# Patient Record
Sex: Female | Born: 1955
Health system: Southern US, Community
[De-identification: ages and names within clinical notes are randomized; demographics above are authoritative.]

## PROBLEM LIST (undated history)

## (undated) DIAGNOSIS — R030 Elevated blood-pressure reading, without diagnosis of hypertension: Secondary | ICD-10-CM

## (undated) DIAGNOSIS — M810 Age-related osteoporosis without current pathological fracture: Secondary | ICD-10-CM

## (undated) DIAGNOSIS — I10 Essential (primary) hypertension: Secondary | ICD-10-CM

## (undated) HISTORY — DX: Essential (primary) hypertension: I10

## (undated) HISTORY — PX: OTHER SURGICAL HISTORY: SHX169

## (undated) HISTORY — DX: Age-related osteoporosis without current pathological fracture: M81.0

## (undated) HISTORY — PX: POLYPECTOMY: SHX149

## (undated) HISTORY — DX: Elevated blood-pressure reading, without diagnosis of hypertension: R03.0

---

## 1983-06-12 HISTORY — PX: TUBAL LIGATION: SHX77

## 2009-06-11 HISTORY — PX: COLONOSCOPY: SHX174

## 2009-11-14 LAB — HM COLONOSCOPY

## 2012-07-28 LAB — CBC AND DIFFERENTIAL
Hemoglobin: 13.1 g/dL (ref 12.0–16.0)
Platelets: 265 10*3/uL (ref 150–399)
WBC: 5.9 10^3/mL

## 2012-07-28 LAB — HEPATIC FUNCTION PANEL
ALT: 15 U/L (ref 7–35)
AST: 18 U/L (ref 13–35)
Alkaline Phosphatase: 59 U/L (ref 25–125)

## 2012-07-28 LAB — BASIC METABOLIC PANEL
Creatinine: 0.7 mg/dL (ref 0.5–1.1)
POTASSIUM: 4.4 mmol/L (ref 3.4–5.3)
SODIUM: 144 mmol/L (ref 137–147)

## 2012-07-28 LAB — LIPID PANEL
Cholesterol: 206 mg/dL — AB (ref 0–200)
HDL: 62 mg/dL (ref 35–70)
LDL Cholesterol: 125 mg/dL
Triglycerides: 95 mg/dL (ref 40–160)

## 2012-07-28 LAB — HEMOGLOBIN A1C: HEMOGLOBIN A1C: 5.7 % (ref 4.0–6.0)

## 2012-07-28 LAB — URIC ACID: Uric Acid: 3.8

## 2013-09-29 ENCOUNTER — Encounter: Payer: Self-pay | Admitting: Family Medicine

## 2013-09-29 DIAGNOSIS — R7303 Prediabetes: Secondary | ICD-10-CM | POA: Insufficient documentation

## 2013-09-29 DIAGNOSIS — Z860100 Personal history of colon polyps, unspecified: Secondary | ICD-10-CM | POA: Insufficient documentation

## 2013-09-29 DIAGNOSIS — M8580 Other specified disorders of bone density and structure, unspecified site: Secondary | ICD-10-CM | POA: Insufficient documentation

## 2013-09-29 DIAGNOSIS — E785 Hyperlipidemia, unspecified: Secondary | ICD-10-CM | POA: Insufficient documentation

## 2013-09-30 ENCOUNTER — Encounter: Payer: Self-pay | Admitting: *Deleted

## 2013-10-01 ENCOUNTER — Encounter: Payer: Self-pay | Admitting: Family Medicine

## 2013-10-01 ENCOUNTER — Ambulatory Visit (INDEPENDENT_AMBULATORY_CARE_PROVIDER_SITE_OTHER): Payer: Federal, State, Local not specified - PPO | Admitting: Family Medicine

## 2013-10-01 VITALS — BP 132/80 | HR 78 | Ht 65.0 in | Wt 134.0 lb

## 2013-10-01 DIAGNOSIS — E785 Hyperlipidemia, unspecified: Secondary | ICD-10-CM

## 2013-10-01 DIAGNOSIS — R7309 Other abnormal glucose: Secondary | ICD-10-CM

## 2013-10-01 DIAGNOSIS — M26629 Arthralgia of temporomandibular joint, unspecified side: Secondary | ICD-10-CM | POA: Insufficient documentation

## 2013-10-01 DIAGNOSIS — M899 Disorder of bone, unspecified: Secondary | ICD-10-CM

## 2013-10-01 DIAGNOSIS — M949 Disorder of cartilage, unspecified: Secondary | ICD-10-CM

## 2013-10-01 DIAGNOSIS — M8580 Other specified disorders of bone density and structure, unspecified site: Secondary | ICD-10-CM

## 2013-10-01 DIAGNOSIS — Z1239 Encounter for other screening for malignant neoplasm of breast: Secondary | ICD-10-CM

## 2013-10-01 DIAGNOSIS — R7303 Prediabetes: Secondary | ICD-10-CM

## 2013-10-01 NOTE — Progress Notes (Signed)
CC: Carolyn Berry is a 58 y.o. female is here for Establish Care   Subjective: HPI:  Very pleasant 58 year old here to establish care  She reports a history of TMJ arthralgia bilaterally but is currently not bothering her. It is well managed with wearing a mouthguard to bed every night. Symptoms are accompanied by pain anterior to the ear and mild dizziness.   Reports a history of prediabetes: Most recent A1c 5.7 in February 2014. Denies polyuria polyphagia polydipsia nor poorly healing wounds. Denies vision loss.  Reports a history of osteopenia. Most recent DEXA scan 2011 showing osteopenia in the lumbar sacral spine. She takes vitamin D 800 units daily and calcium 1200 mg daily. She has fractured her toe many years ago but denies any other fractures. She engages in weightbearing activity most days of the week at her gym  She reports a history of hyperlipidemia that is described as borderline. She's never been on medications for this.  Review of Systems - General ROS: negative for - chills, fever, night sweats, weight gain or weight loss Ophthalmic ROS: negative for - decreased vision Psychological ROS: negative for - anxiety or depression ENT ROS: negative for - hearing change, nasal congestion, tinnitus or allergies Hematological and Lymphatic ROS: negative for - bleeding problems, bruising or swollen lymph nodes Breast ROS: negative Respiratory ROS: no cough, shortness of breath, or wheezing Cardiovascular ROS: no chest pain or dyspnea on exertion Gastrointestinal ROS: no abdominal pain, change in bowel habits, or black or bloody stools Genito-Urinary ROS: negative for - genital discharge, genital ulcers, incontinence or abnormal bleeding from genitals Musculoskeletal ROS: negative for - joint pain or muscle pain Neurological ROS: negative for - headaches or memory loss Dermatological ROS: negative for lumps, mole changes, rash and skin lesion changes  History reviewed. No  pertinent past medical history.  Past Surgical History  Procedure Laterality Date  . Tubal ligation  1985   Family History  Problem Relation Age of Onset  . Alcoholism      brother  . Cancer Mother   . Hypertension Mother     History   Social History  . Marital Status: Married    Spouse Name: N/A    Number of Children: N/A  . Years of Education: N/A   Occupational History  . Not on file.   Social History Main Topics  . Smoking status: Never Smoker   . Smokeless tobacco: Not on file  . Alcohol Use: Yes  . Drug Use: No  . Sexual Activity: Yes    Partners: Male   Other Topics Concern  . Not on file   Social History Narrative  . No narrative on file     Objective: BP 132/80  Pulse 78  Ht 5\' 5"  (1.651 m)  Wt 134 lb (60.782 kg)  BMI 22.30 kg/m2  General: Alert and Oriented, No Acute Distress HEENT: Pupils equal, round, reactive to light. Conjunctivae clear.  External ears unremarkable, canals clear with intact TMs with appropriate landmarks.  Middle ear appears open without effusion. Pink inferior turbinates.  Moist mucous membranes, pharynx without inflammation nor lesions.  Neck supple without palpable lymphadenopathy nor abnormal masses. Lungs: Clear to auscultation bilaterally, no wheezing/ronchi/rales.  Comfortable work of breathing. Good air movement. Cardiac: Regular rate and rhythm. Normal S1/S2.  No murmurs, rubs, nor gallops.   Extremities: No peripheral edema.  Strong peripheral pulses.  Mental Status: No depression, anxiety, nor agitation. Skin: Warm and dry.  Assessment & Plan: Tashyra was seen today  for establish care.  Diagnoses and associated orders for this visit:  Prediabetes - HgB S3M - BASIC METABOLIC PANEL WITH GFR  Hyperlipidemia - Lipid panel  Osteopenia - DG Bone Density; Future  Breast cancer screening - MM DIGITAL SCREENING BILATERAL; Future  TMJ arthralgia    Prediabetes: Clinically controlled however due for fasting blood  sugar and A1c Hyperlipidemia: Clinically control but due for annual lipid panel Osteopenia: Overdue for followup DEXA scan TMJ: Stable continue to wear mouth guard as needed It has been greater than one year since her last mammogram we ordered a routine screening today  Followup will be ultimately determined based on above results   Return if symptoms worsen or fail to improve.

## 2013-10-13 ENCOUNTER — Ambulatory Visit (INDEPENDENT_AMBULATORY_CARE_PROVIDER_SITE_OTHER): Payer: Federal, State, Local not specified - PPO

## 2013-10-13 DIAGNOSIS — M8580 Other specified disorders of bone density and structure, unspecified site: Secondary | ICD-10-CM

## 2013-10-13 DIAGNOSIS — M949 Disorder of cartilage, unspecified: Secondary | ICD-10-CM

## 2013-10-13 DIAGNOSIS — M899 Disorder of bone, unspecified: Secondary | ICD-10-CM

## 2013-10-13 DIAGNOSIS — Z1239 Encounter for other screening for malignant neoplasm of breast: Secondary | ICD-10-CM

## 2013-10-13 DIAGNOSIS — Z1231 Encounter for screening mammogram for malignant neoplasm of breast: Secondary | ICD-10-CM

## 2013-10-13 LAB — LIPID PANEL
Cholesterol: 194 mg/dL (ref 0–200)
HDL: 64 mg/dL (ref >39)
LDL Cholesterol: 119 mg/dL — ABNORMAL HIGH (ref 0–99)
Total CHOL/HDL Ratio: 3 Ratio
Triglycerides: 57 mg/dL (ref <150)
VLDL: 11 mg/dL (ref 0–40)

## 2013-10-13 LAB — BASIC METABOLIC PANEL WITH GFR
BUN: 17 mg/dL (ref 6–23)
CHLORIDE: 104 meq/L (ref 96–112)
CO2: 31 meq/L (ref 19–32)
Calcium: 9.3 mg/dL (ref 8.4–10.5)
Creat: 0.79 mg/dL (ref 0.50–1.10)
GFR, EST NON AFRICAN AMERICAN: 82 mL/min
GFR, Est African American: >89 mL/min
Glucose, Bld: 84 mg/dL (ref 70–99)
Potassium: 4.2 mEq/L (ref 3.5–5.3)
Sodium: 140 mEq/L (ref 135–145)

## 2013-10-13 LAB — HEMOGLOBIN A1C
HEMOGLOBIN A1C: 6 % — AB (ref <5.7)
MEAN PLASMA GLUCOSE: 126 mg/dL — AB (ref <117)

## 2013-10-14 ENCOUNTER — Encounter: Payer: Self-pay | Admitting: Family Medicine

## 2014-03-19 ENCOUNTER — Ambulatory Visit (INDEPENDENT_AMBULATORY_CARE_PROVIDER_SITE_OTHER): Payer: Federal, State, Local not specified - PPO | Admitting: Family Medicine

## 2014-03-19 ENCOUNTER — Encounter: Payer: Self-pay | Admitting: Family Medicine

## 2014-03-19 VITALS — BP 118/82 | HR 80 | Ht 65.0 in | Wt 134.0 lb

## 2014-03-19 DIAGNOSIS — Z808 Family history of malignant neoplasm of other organs or systems: Secondary | ICD-10-CM | POA: Insufficient documentation

## 2014-03-19 DIAGNOSIS — M8580 Other specified disorders of bone density and structure, unspecified site: Secondary | ICD-10-CM

## 2014-03-19 DIAGNOSIS — Z0000 Encounter for general adult medical examination without abnormal findings: Secondary | ICD-10-CM | POA: Diagnosis not present

## 2014-03-19 DIAGNOSIS — Z23 Encounter for immunization: Secondary | ICD-10-CM

## 2014-03-19 DIAGNOSIS — R7303 Prediabetes: Secondary | ICD-10-CM

## 2014-03-19 DIAGNOSIS — E785 Hyperlipidemia, unspecified: Secondary | ICD-10-CM

## 2014-03-19 MED ORDER — ZOSTER VACCINE LIVE 19400 UNT/0.65ML ~~LOC~~ SOLR: 0.6500 mL | Freq: Once | SUBCUTANEOUS | Status: DC

## 2014-03-19 NOTE — Patient Instructions (Signed)
Dr. Vijay Durflinger's General Advice Following Your Complete Physical Exam  The Benefits of Regular Exercise: Unless you suffer from an uncontrolled cardiovascular condition, studies strongly suggest that regular exercise and physical activity will add to both the quality and length of your life.  The World Health Organization recommends 150 minutes of moderate intensity aerobic activity every week.  This is best split over 3-4 days a week, and can be as simple as a brisk walk for just over 35 minutes "most days of the week".  This type of exercise has been shown to lower LDL-Cholesterol, lower average blood sugars, lower blood pressure, lower cardiovascular disease risk, improve memory, and increase one's overall sense of wellbeing.  The addition of anaerobic (or "strength training") exercises offers additional benefits including but not limited to increased metabolism, prevention of osteoporosis, and improved overall cholesterol levels.  How Can I Strive For A Low-Fat Diet?: Current guidelines recommend that 25-35 percent of your daily energy (food) intake should come from fats.  One might ask how can this be achieved without having to dissect each meal on a daily basis?  Switch to skim or 1% milk instead of whole milk.  Focus on lean meats such as ground turkey, fresh fish, baked chicken, and lean cuts of beef as your source of dietary protein.  Limit saturated fat consumption to less than 10% of your daily caloric intake.  Limit trans fatty acid consumption primarily by limiting synthetic trans fats such as partially hydrogenated oils (Ex: fried fast foods).  Substitute olive or vegetable oil for solid fats where possible.  Moderation of Salt Intake: Provided you don't carry a diagnosis of congestive heart failure nor renal failure, I recommend a daily allowance of no more than 2300 mg of salt (sodium).  Keeping under this daily goal is associated with a decreased risk of cardiovascular events, creeping  above it can lead to elevated blood pressures and increases your risk of cardiovascular events.  Milligrams (mg) of salt is listed on all nutrition labels, and your daily intake can add up faster than you think.  Most canned and frozen dinners can pack in over half your daily salt allowance in one meal.    Lifestyle Health Risks: Certain lifestyle choices carry specific health risks.  As you may already know, tobacco use has been associated with increasing one's risk of cardiovascular disease, pulmonary disease, numerous cancers, among many other issues.  What you may not know is that there are medications and nicotine replacement strategies that can more than double your chances of successfully quitting.  I would be thrilled to help manage your quitting strategy if you currently use tobacco products.  When it comes to alcohol use, I've yet to find an "ideal" daily allowance.  Provided an individual does not have a medical condition that is exacerbated by alcohol consumption, general guidelines determine "safe drinking" as no more than two standard drinks for a man or no more than one standard drink for a female per day.  However, much debate still exists on whether any amount of alcohol consumption is technically "safe".  My general advice, keep alcohol consumption to a minimum for general health promotion.  If you or others believe that alcohol, tobacco, or recreational drug use is interfering with your life, I would be happy to provide confidential counseling regarding treatment options.  General "Over The Counter" Nutrition Advice: Postmenopausal women should aim for a daily calcium intake of 1200 mg, however a significant portion of this might already be   provided by diets including milk, yogurt, cheese, and other dairy products.  Vitamin D has been shown to help preserve bone density, prevent fatigue, and has even been shown to help reduce falls in the elderly.  Ensuring a daily intake of 800 Units of  Vitamin D is a good place to start to enjoy the above benefits, we can easily check your Vitamin D level to see if you'd potentially benefit from supplementation beyond 800 Units a day.  Folic Acid intake should be of particular concern to women of childbearing age.  Daily consumption of 400-800 mcg of Folic Acid is recommended to minimize the chance of spinal cord defects in a fetus should pregnancy occur.    For many adults, accidents still remain one of the most common culprits when it comes to cause of death.  Some of the simplest but most effective preventitive habits you can adopt include regular seatbelt use, proper helmet use, securing firearms, and regularly testing your smoke and carbon monoxide detectors.  Carolyn Monrroy B. Mallory Schaad DO [facility] [address], [address] [place], [place] [address] Phone: [phone]  

## 2014-03-19 NOTE — Progress Notes (Signed)
CC: Carolyn Berry is a 58 y.o. female is here for Annual Exam   Subjective: HPI:  Colonoscopy: Repeat due 2016 Papsmear: Estimated that last Pap smear was in 2013 and has always been normal. Repeat due in 2016-17 Mammogram: 10/2013 normal repeat 10/2014  DEXA: Osteopenia as of May 2015, repeat 2017  Influenza Vaccine: Will receive today Pneumovax: No current indication Td/Tdap: 2011 UTD Zoster: Was given a prescription today to have this done at a local pharmacy  No alcohol use tobacco or recreational drug use  Review of Systems - General ROS: negative for - chills, fever, night sweats, weight gain or weight loss Ophthalmic ROS: negative for - decreased vision Psychological ROS: negative for - anxiety or depression ENT ROS: negative for - hearing change, nasal congestion, tinnitus or allergies Hematological and Lymphatic ROS: negative for - bleeding problems, bruising or swollen lymph nodes Breast ROS: negative Respiratory ROS: no cough, shortness of breath, or wheezing Cardiovascular ROS: no chest pain or dyspnea on exertion Gastrointestinal ROS: no abdominal pain, change in bowel habits, or black or bloody stools Genito-Urinary ROS: negative for - genital discharge, genital ulcers, incontinence or abnormal bleeding from genitals Musculoskeletal ROS: negative for - joint pain or muscle pain Neurological ROS: negative for - headaches or memory loss Dermatological ROS: negative for lumps, mole changes, rash and skin lesion changes  History reviewed. No pertinent past medical history.  Past Surgical History  Procedure Laterality Date  . Tubal ligation  1985   Family History  Problem Relation Age of Onset  . Alcoholism      brother  . Cancer Mother   . Hypertension Mother     History   Social History  . Marital Status: Married    Spouse Name: N/A    Number of Children: N/A  . Years of Education: N/A   Occupational History  . Not on file.   Social History Main  Topics  . Smoking status: Never Smoker   . Smokeless tobacco: Not on file  . Alcohol Use: Yes  . Drug Use: No  . Sexual Activity: Yes    Partners: Male   Other Topics Concern  . Not on file   Social History Narrative  . No narrative on file     Objective: BP 118/82  Pulse 80  Ht 5\' 5"  (1.651 m)  Wt 134 lb (60.782 kg)  BMI 22.30 kg/m2  General: No Acute Distress HEENT: Atraumatic, normocephalic, conjunctivae normal without scleral icterus.  No nasal discharge, hearing grossly intact, TMs with good landmarks bilaterally with no middle ear abnormalities, posterior pharynx clear without oral lesions. Neck: Supple, trachea midline, no cervical nor supraclavicular adenopathy. Pulmonary: Clear to auscultation bilaterally without wheezing, rhonchi, nor rales. Cardiac: Regular rate and rhythm.  No murmurs, rubs, nor gallops. No peripheral edema.  2+ peripheral pulses bilaterally. Abdomen: Bowel sounds normal.  No masses.  Non-tender without rebound.  Negative Murphy's sign. MSK: Grossly intact, no signs of weakness.  Full strength throughout upper and lower extremities.  Full ROM in upper and lower extremities.  No midline spinal tenderness. Neuro: Gait unremarkable, CN II-XII grossly intact.  C5-C6 Reflex 2/4 Bilaterally, L4 Reflex 2/4 Bilaterally.  Cerebellar function intact. Skin: No rashes. Seborrheic keratoses on the back and neck Psych: Alert and oriented to person/place/time.  Thought process normal. No anxiety/depression.  Assessment & Plan: Lyvia was seen today for annual exam.  Diagnoses and associated orders for this visit:  Influenza vaccine needed - Flu Vaccine QUAD 36+ mos PF  IM (Fluarix Quad PF)  Hyperlipidemia  Osteopenia  Prediabetes - HgB A1c  Annual physical exam - HgB A1c - zoster vaccine live, PF, (ZOSTAVAX) 88828 UNT/0.65ML injection; Inject 19,400 Units into the skin once.  Family history of melanoma    Healthy lifestyle interventions including  but not limited to regular exercise, a healthy low fat diet, moderation of salt intake, the dangers of tobacco/alcohol/recreational drug use, nutrition supplementation, and accident avoidance were discussed with the patient and a handout was provided for future reference.  Due for A1c to followup prediabetes   Return in about 1 year (around 03/20/2015).

## 2014-03-20 LAB — HEMOGLOBIN A1C
Hgb A1c MFr Bld: 5.7 % — ABNORMAL HIGH (ref <5.7)
Mean Plasma Glucose: 117 mg/dL — ABNORMAL HIGH (ref <117)

## 2014-06-15 ENCOUNTER — Encounter: Payer: Self-pay | Admitting: *Deleted

## 2014-09-20 ENCOUNTER — Encounter: Payer: Self-pay | Admitting: *Deleted

## 2014-09-20 ENCOUNTER — Emergency Department
Admission: EM | Admit: 2014-09-20 | Discharge: 2014-09-20 | Disposition: A | Discharge status: Home / Self Care | Payer: Federal, State, Local not specified - PPO | Source: Home / Self Care | Attending: Family Medicine | Admitting: Family Medicine

## 2014-09-20 DIAGNOSIS — J029 Acute pharyngitis, unspecified: Secondary | ICD-10-CM

## 2014-09-20 LAB — POCT RAPID STREP A (OFFICE): RAPID STREP A SCREEN: NEGATIVE

## 2014-09-20 NOTE — Discharge Instructions (Signed)
If cold-like symptoms develop, try the following: Take plain guaifenesin (1200mg  extended release tabs such as Mucinex) twice daily, with plenty of water, for cough and congestion.  May add Pseudoephedrine (30mg , one or two every 4 to 6 hours) for sinus congestion.  Get adequate rest.   May use Afrin nasal spray (or generic oxymetazoline) twice daily for about 5 days.  Also recommend using saline nasal spray several times daily and saline nasal irrigation (AYR is a common brand).   Try warm salt water gargles for sore throat.  Stop all antihistamines for now, and other non-prescription cough/cold preparations. May take Ibuprofen 200mg , 4 tabs every 8 hours with food for sore throat, body aches, etc.   Salt Water Gargle This solution will help make your mouth and throat feel better. HOME CARE INSTRUCTIONS   Mix 1 teaspoon of salt in 8 ounces of warm water.  Gargle with this solution as much or often as you need or as directed. Swish and gargle gently if you have any sores or wounds in your mouth.  Do not swallow this mixture. Document Released: 03/01/2004 Document Revised: 08/20/2011 Document Reviewed: 07/23/2008 [facility] Patient Information 2015 [place], [place]. This information is not intended to replace advice given to you by your health care provider. Make sure you discuss any questions you have with your health care provider.

## 2014-09-20 NOTE — ED Provider Notes (Signed)
CSN: [id number]     Arrival date & time 09/20/14  1401 History   None    Chief Complaint  Patient presents with  . Sore Throat      HPI Comments: Patient developed a sore throat last night with minimal other symptoms.  She is concerned about possible strep, having been exposed to her young grandson with strep pharyngitis.  The history is provided by the patient.    History reviewed. No pertinent past medical history. Past Surgical History  Procedure Laterality Date  . Tubal ligation  1985   Family History  Problem Relation Age of Onset  . Alcoholism      brother  . Cancer Mother   . Hypertension Mother    History  Substance Use Topics  . Smoking status: Never Smoker   . Smokeless tobacco: Not on file  . Alcohol Use: Yes   OB History    No data available     Review of Systems + sore throat No cough No pleuritic pain No wheezing No nasal congestion ? post-nasal drainage No sinus pain/pressure No itchy/red eyes No earache No hemoptysis No SOB No fever/chills No nausea No vomiting No abdominal pain No diarrhea No urinary symptoms No skin rash + fatigue No myalgias No headache Used OTC meds without relief  Allergies  Review of patient's allergies indicates no known allergies.  Home Medications   Prior to Admission medications   Medication Sig Start Date End Date Taking? Authorizing Provider  Calcium Carbonate-Vitamin D (CALCIUM + D PO) Take by mouth.    Historical Provider, MD  Multiple Vitamins-Minerals (MULTIVITAMIN PO) Take by mouth.    Historical Provider, MD  zoster vaccine live, PF, (ZOSTAVAX) 62831 UNT/0.65ML injection Inject 19,400 Units into the skin once. 03/19/14   Sean Hommel, DO   BP 151/90 mmHg  Pulse 75  Temp(Src) 98.3 F (36.8 C) (Oral)  Resp 16  Ht 5\' 5"  (1.651 m)  Wt 135 lb (61.236 kg)  BMI 22.47 kg/m2  SpO2 99% Physical Exam Nursing notes and Vital Signs reviewed. Appearance:  Patient appears stated age, and in no acute  distress Eyes:  Pupils are equal, round, and reactive to light and accomodation.  Extraocular movement is intact.  Conjunctivae are not inflamed  Ears:  Canals normal.  Tympanic membranes normal.  Nose:  Mildly congested turbinates.  No sinus tenderness.   Pharynx:  Small aphthous ulcer on uvula; minimal erythema Neck:  Supple.   Nontender enlarged posterior nodes are palpated bilaterally  Lungs:  Clear to auscultation.  Breath sounds are equal.  Heart:  Regular rate and rhythm without murmurs, rubs, or gallops.  Abdomen:  Nontender without masses or hepatosplenomegaly.  Bowel sounds are present.  No CVA or flank tenderness.  Extremities:  No edema.  No calf tenderness Skin:  No rash present.   ED Course  Procedures  None   Labs Reviewed  STREP A DNA PROBE  POCT RAPID STREP A (OFFICE) negative         MDM   1. Acute pharyngitis, unspecified pharyngitis type; suspect early viral URI    There is no evidence of bacterial infection today.  Throat culture pending  If cold-like symptoms develop, try the following: Take plain guaifenesin (1200mg  extended release tabs such as Mucinex) twice daily, with plenty of water, for cough and congestion.  May add Pseudoephedrine (30mg , one or two every 4 to 6 hours) for sinus congestion.  Get adequate rest.   May use Afrin nasal spray (  or generic oxymetazoline) twice daily for about 5 days.  Also recommend using saline nasal spray several times daily and saline nasal irrigation (AYR is a common brand).   Try warm salt water gargles for sore throat.  Stop all antihistamines for now, and other non-prescription cough/cold preparations. May take Ibuprofen 200mg , 4 tabs every 8 hours with food for sore throat, body aches, etc.    Kandra Nicolas, MD 09/21/14 [phone]

## 2014-09-20 NOTE — ED Notes (Signed)
Pt c/o sore throat x 1 day.denies fever. Reports grandson with strep.

## 2014-09-21 LAB — STREP A DNA PROBE: GASP: NEGATIVE

## 2014-09-24 ENCOUNTER — Telehealth: Payer: Self-pay | Admitting: Emergency Medicine

## 2014-09-24 NOTE — ED Notes (Signed)
Inquired about patient's status; encourage them to call with questions/concerns.  

## 2015-08-30 ENCOUNTER — Telehealth: Payer: Self-pay

## 2015-08-30 DIAGNOSIS — Z0000 Encounter for general adult medical examination without abnormal findings: Secondary | ICD-10-CM

## 2015-08-30 NOTE — Telephone Encounter (Signed)
Pt.notified

## 2015-08-30 NOTE — Telephone Encounter (Signed)
Evonia, Will you please let patient know that I've put lab orders at the front desk that she can use to make a fasting lab only visit at her convenience.

## 2015-08-30 NOTE — Telephone Encounter (Signed)
Sybil called and states she has an appointment in May. She would like to have blood work before the office visit. What labs would you like patient to have done?

## 2015-10-13 DIAGNOSIS — Z0000 Encounter for general adult medical examination without abnormal findings: Secondary | ICD-10-CM | POA: Diagnosis not present

## 2015-10-13 DIAGNOSIS — K080 Exfoliation of teeth due to systemic causes: Secondary | ICD-10-CM | POA: Diagnosis not present

## 2015-10-14 LAB — COMPLETE METABOLIC PANEL WITH GFR
ALBUMIN: 4.2 g/dL (ref 3.6–5.1)
ALK PHOS: 56 U/L (ref 33–130)
ALT: 6 U/L (ref 6–29)
AST: 12 U/L (ref 10–35)
BILIRUBIN TOTAL: 0.6 mg/dL (ref 0.2–1.2)
BUN: 15 mg/dL (ref 7–25)
CALCIUM: 9.1 mg/dL (ref 8.6–10.4)
CO2: 29 mmol/L (ref 20–31)
CREATININE: 0.73 mg/dL (ref 0.50–0.99)
Chloride: 104 mmol/L (ref 98–110)
GFR, Est African American: >89 mL/min (ref >=60)
GFR, Est Non African American: 89 mL/min (ref >=60)
Glucose, Bld: 82 mg/dL (ref 65–99)
Potassium: 4.4 mmol/L (ref 3.5–5.3)
Sodium: 141 mmol/L (ref 135–146)
TOTAL PROTEIN: 6.1 g/dL (ref 6.1–8.1)

## 2015-10-14 LAB — CBC
HCT: 43.8 % (ref 35.0–45.0)
Hemoglobin: 13.9 g/dL (ref 11.7–15.5)
MCH: 26.3 pg — ABNORMAL LOW (ref 27.0–33.0)
MCHC: 31.7 g/dL — AB (ref 32.0–36.0)
MCV: 83 fL (ref 80.0–100.0)
MPV: 9.3 fL (ref 7.5–12.5)
Platelets: 260 10*3/uL (ref 140–400)
RBC: 5.28 MIL/uL — ABNORMAL HIGH (ref 3.80–5.10)
RDW: 13.3 % (ref 11.0–15.0)
WBC: 6.3 10*3/uL (ref 3.8–10.8)

## 2015-10-14 LAB — HEMOGLOBIN A1C
HEMOGLOBIN A1C: 5.7 % — AB (ref <5.7)
MEAN PLASMA GLUCOSE: 117 mg/dL

## 2015-10-14 LAB — LIPID PANEL
CHOLESTEROL: 189 mg/dL (ref 125–200)
HDL: 64 mg/dL (ref >=46)
LDL Cholesterol: 114 mg/dL (ref <130)
Total CHOL/HDL Ratio: 3 Ratio (ref <=5.0)
Triglycerides: 56 mg/dL (ref <150)
VLDL: 11 mg/dL (ref <30)

## 2015-10-19 ENCOUNTER — Ambulatory Visit (INDEPENDENT_AMBULATORY_CARE_PROVIDER_SITE_OTHER): Payer: Federal, State, Local not specified - PPO | Admitting: Family Medicine

## 2015-10-19 ENCOUNTER — Encounter: Payer: Self-pay | Admitting: Family Medicine

## 2015-10-19 VITALS — BP 129/91 | HR 73 | Wt 134.0 lb

## 2015-10-19 DIAGNOSIS — Z1211 Encounter for screening for malignant neoplasm of colon: Secondary | ICD-10-CM | POA: Diagnosis not present

## 2015-10-19 DIAGNOSIS — Z86010 Personal history of colonic polyps: Secondary | ICD-10-CM | POA: Diagnosis not present

## 2015-10-19 DIAGNOSIS — D3140 Benign neoplasm of unspecified ciliary body: Secondary | ICD-10-CM | POA: Insufficient documentation

## 2015-10-19 DIAGNOSIS — Z1239 Encounter for other screening for malignant neoplasm of breast: Secondary | ICD-10-CM

## 2015-10-19 DIAGNOSIS — M8580 Other specified disorders of bone density and structure, unspecified site: Secondary | ICD-10-CM

## 2015-10-19 DIAGNOSIS — D3141 Benign neoplasm of right ciliary body: Secondary | ICD-10-CM

## 2015-10-19 DIAGNOSIS — Z0000 Encounter for general adult medical examination without abnormal findings: Secondary | ICD-10-CM

## 2015-10-19 NOTE — Patient Instructions (Signed)
Dr. Nance Mccombs's General Advice Following Your Complete Physical Exam  The Benefits of Regular Exercise: Unless you suffer from an uncontrolled cardiovascular condition, studies strongly suggest that regular exercise and physical activity will add to both the quality and length of your life.  The World Health Organization recommends 150 minutes of moderate intensity aerobic activity every week.  This is best split over 3-4 days a week, and can be as simple as a brisk walk for just over 35 minutes "most days of the week".  This type of exercise has been shown to lower LDL-Cholesterol, lower average blood sugars, lower blood pressure, lower cardiovascular disease risk, improve memory, and increase one's overall sense of wellbeing.  The addition of anaerobic (or "strength training") exercises offers additional benefits including but not limited to increased metabolism, prevention of osteoporosis, and improved overall cholesterol levels.  How Can I Strive For A Low-Fat Diet?: Current guidelines recommend that 25-35 percent of your daily energy (food) intake should come from fats.  One might ask how can this be achieved without having to dissect each meal on a daily basis?  Switch to skim or 1% milk instead of whole milk.  Focus on lean meats such as ground turkey, fresh fish, baked chicken, and lean cuts of beef as your source of dietary protein.  Limit saturated fat consumption to less than 10% of your daily caloric intake.  Limit trans fatty acid consumption primarily by limiting synthetic trans fats such as partially hydrogenated oils (Ex: fried fast foods).  Substitute olive or vegetable oil for solid fats where possible.  Moderation of Salt Intake: Provided you don't carry a diagnosis of congestive heart failure nor renal failure, I recommend a daily allowance of no more than 2300 mg of salt (sodium).  Keeping under this daily goal is associated with a decreased risk of cardiovascular events, creeping  above it can lead to elevated blood pressures and increases your risk of cardiovascular events.  Milligrams (mg) of salt is listed on all nutrition labels, and your daily intake can add up faster than you think.  Most canned and frozen dinners can pack in over half your daily salt allowance in one meal.    Lifestyle Health Risks: Certain lifestyle choices carry specific health risks.  As you may already know, tobacco use has been associated with increasing one's risk of cardiovascular disease, pulmonary disease, numerous cancers, among many other issues.  What you may not know is that there are medications and nicotine replacement strategies that can more than double your chances of successfully quitting.  I would be thrilled to help manage your quitting strategy if you currently use tobacco products.  When it comes to alcohol use, I've yet to find an "ideal" daily allowance.  Provided an individual does not have a medical condition that is exacerbated by alcohol consumption, general guidelines determine "safe drinking" as no more than two standard drinks for a man or no more than one standard drink for a female per day.  However, much debate still exists on whether any amount of alcohol consumption is technically "safe".  My general advice, keep alcohol consumption to a minimum for general health promotion.  If you or others believe that alcohol, tobacco, or recreational drug use is interfering with your life, I would be happy to provide confidential counseling regarding treatment options.  General "Over The Counter" Nutrition Advice: Postmenopausal women should aim for a daily calcium intake of 1200 mg, however a significant portion of this might already be   provided by diets including milk, yogurt, cheese, and other dairy products.  Vitamin D has been shown to help preserve bone density, prevent fatigue, and has even been shown to help reduce falls in the elderly.  Ensuring a daily intake of 800 Units of  Vitamin D is a good place to start to enjoy the above benefits, we can easily check your Vitamin D level to see if you'd potentially benefit from supplementation beyond 800 Units a day.  Folic Acid intake should be of particular concern to women of childbearing age.  Daily consumption of 400-800 mcg of Folic Acid is recommended to minimize the chance of spinal cord defects in a fetus should pregnancy occur.    For many adults, accidents still remain one of the most common culprits when it comes to cause of death.  Some of the simplest but most effective preventitive habits you can adopt include regular seatbelt use, proper helmet use, securing firearms, and regularly testing your smoke and carbon monoxide detectors.  Carolyn Berry B. Carolyn Outland DO [facility] [address], [address] [place], [place] [address] Phone: [phone]  

## 2015-10-19 NOTE — Progress Notes (Signed)
CC: Carolyn Berry is a 60 y.o. female is here for Annual Exam and Results   Subjective: HPI:  Colonoscopy: Overdue for colonoscopy, or has been placed Papsmear: Last in 2014, repeat by 2019 Mammogram: (Overdue for mammogram, orders have been placed  DEXA: Slightly overdue for DEXA scan, order has been placed  Influenza Vaccine: No current indication Pneumovax: No current indication Td/Tdap: Up-to-date Zoster: Up-to-date from last year  Requesting complete physical exam with no acute complaints  Review of Systems - General ROS: negative for - chills, fever, night sweats, weight gain or weight loss Ophthalmic ROS: negative for - decreased vision Psychological ROS: negative for - anxiety or depression ENT ROS: negative for - hearing change, nasal congestion, tinnitus or allergies Hematological and Lymphatic ROS: negative for - bleeding problems, bruising or swollen lymph nodes Breast ROS: negative Respiratory ROS: no cough, shortness of breath, or wheezing Cardiovascular ROS: no chest pain or dyspnea on exertion Gastrointestinal ROS: no abdominal pain, change in bowel habits, or black or bloody stools Genito-Urinary ROS: negative for - genital discharge, genital ulcers, incontinence or abnormal bleeding from genitals Musculoskeletal ROS: negative for - joint pain or muscle pain Neurological ROS: negative for - headaches or memory loss Dermatological ROS: negative for lumps, mole changes, rash and skin lesion changes  History reviewed. No pertinent past medical history.  Past Surgical History  Procedure Laterality Date  . Tubal ligation  1985   Family History  Problem Relation Age of Onset  . Alcoholism      brother  . Cancer Mother   . Hypertension Mother     Social History   Social History  . Marital Status: Married    Spouse Name: N/A  . Number of Children: N/A  . Years of Education: N/A   Occupational History  . Not on file.   Social History Main Topics  .  Smoking status: Never Smoker   . Smokeless tobacco: Not on file  . Alcohol Use: Yes  . Drug Use: No  . Sexual Activity:    Partners: Male   Other Topics Concern  . Not on file   Social History Narrative     Objective: BP 129/91 mmHg  Pulse 73  Wt 134 lb (60.782 kg)  General: No Acute Distress HEENT: Atraumatic, normocephalic, conjunctivae normal without scleral icterus.  No nasal discharge, hearing grossly intact, TMs with good landmarks bilaterally with no middle ear abnormalities, posterior pharynx clear without oral lesions. Neck: Supple, trachea midline, no cervical nor supraclavicular adenopathy. Brown pigmented lesion approximately 3 mm in diameter in the 2:00 position of the right iris. Pulmonary: Clear to auscultation bilaterally without wheezing, rhonchi, nor rales. Cardiac: Regular rate and rhythm.  No murmurs, rubs, nor gallops. No peripheral edema.  2+ peripheral pulses bilaterally. Abdomen: Bowel sounds normal.  No masses.  Non-tender without rebound.  Negative Murphy's sign. MSK: Grossly intact, no signs of weakness.  Full strength throughout upper and lower extremities.  Full ROM in upper and lower extremities.  No midline spinal tenderness. Neuro: Gait unremarkable, CN II-XII grossly intact.  C5-C6 Reflex 2/4 Bilaterally, L4 Reflex 2/4 Bilaterally.  Cerebellar function intact. Skin: No rashes. Psych: Alert and oriented to person/place/time.  Thought process normal. No anxiety/depression.  Assessment & Plan: Carolyn Berry was seen today for annual exam and results.  Diagnoses and all orders for this visit:  Personal history of colonic polyps  Osteopenia -     DG Bone Density  Screening for breast cancer -  MM DIGITAL SCREENING BILATERAL; Future  Special screening for malignant neoplasms, colon -     Ambulatory referral to Gastroenterology  Annual physical exam  Iris nevus, right   Healthy lifestyle interventions including but not limited to regular  exercise, a healthy low fat diet, moderation of salt intake, the dangers of tobacco/alcohol/recreational drug use, nutrition supplementation, and accident avoidance were discussed with the patient and a handout was provided for future reference. She tells me the pigmented lesion has been in her right eye for her entire life.  Return in about 6 months (around 04/20/2016) for sugar check.

## 2015-10-31 ENCOUNTER — Telehealth: Payer: Self-pay | Admitting: Family Medicine

## 2015-10-31 DIAGNOSIS — Z1211 Encounter for screening for malignant neoplasm of colon: Secondary | ICD-10-CM

## 2015-10-31 NOTE — Telephone Encounter (Signed)
Left detailed message on patient vm with information as noted below. Evva Din,CMA  

## 2015-10-31 NOTE — Telephone Encounter (Signed)
Will you please let patient know that based on records from [facility] her last colonoscopy was in June 2011 and it was recommended she have this repeated in five years from that date.  Since it appears she's due for a routine colonoscopy I've placed a referral today with a Gastroenterologist

## 2015-11-09 ENCOUNTER — Encounter: Payer: Self-pay | Admitting: Family Medicine

## 2016-01-04 ENCOUNTER — Encounter: Payer: Self-pay | Admitting: Family Medicine

## 2016-04-19 DIAGNOSIS — K080 Exfoliation of teeth due to systemic causes: Secondary | ICD-10-CM | POA: Diagnosis not present

## 2016-07-09 DIAGNOSIS — K080 Exfoliation of teeth due to systemic causes: Secondary | ICD-10-CM | POA: Diagnosis not present

## 2016-10-18 DIAGNOSIS — K080 Exfoliation of teeth due to systemic causes: Secondary | ICD-10-CM | POA: Diagnosis not present

## 2017-01-03 ENCOUNTER — Other Ambulatory Visit (HOSPITAL_COMMUNITY)
Admission: RE | Admit: 2017-01-03 | Discharge: 2017-01-03 | Disposition: A | Discharge status: Home / Self Care | Payer: Federal, State, Local not specified - PPO | Source: Ambulatory Visit | Attending: Osteopathic Medicine | Admitting: Osteopathic Medicine

## 2017-01-03 ENCOUNTER — Ambulatory Visit (INDEPENDENT_AMBULATORY_CARE_PROVIDER_SITE_OTHER): Payer: Federal, State, Local not specified - PPO | Admitting: Osteopathic Medicine

## 2017-01-03 ENCOUNTER — Encounter: Payer: Self-pay | Admitting: Osteopathic Medicine

## 2017-01-03 VITALS — BP 127/78 | HR 80 | Ht 65.0 in | Wt 132.0 lb

## 2017-01-03 DIAGNOSIS — M8580 Other specified disorders of bone density and structure, unspecified site: Secondary | ICD-10-CM | POA: Diagnosis not present

## 2017-01-03 DIAGNOSIS — Z1239 Encounter for other screening for malignant neoplasm of breast: Secondary | ICD-10-CM

## 2017-01-03 DIAGNOSIS — Z860100 Personal history of colon polyps, unspecified: Secondary | ICD-10-CM

## 2017-01-03 DIAGNOSIS — Z0000 Encounter for general adult medical examination without abnormal findings: Secondary | ICD-10-CM | POA: Diagnosis not present

## 2017-01-03 DIAGNOSIS — Z1211 Encounter for screening for malignant neoplasm of colon: Secondary | ICD-10-CM | POA: Diagnosis not present

## 2017-01-03 DIAGNOSIS — Z124 Encounter for screening for malignant neoplasm of cervix: Secondary | ICD-10-CM | POA: Insufficient documentation

## 2017-01-03 DIAGNOSIS — Z1231 Encounter for screening mammogram for malignant neoplasm of breast: Secondary | ICD-10-CM

## 2017-01-03 DIAGNOSIS — R7303 Prediabetes: Secondary | ICD-10-CM

## 2017-01-03 MED ORDER — ZOSTER VAC RECOMB ADJUVANTED 50 MCG/0.5ML IM SUSR: 0.5000 mL | Freq: Once | INTRAMUSCULAR | 1 refills | Status: AC

## 2017-01-03 NOTE — Progress Notes (Signed)
HPI: Carolyn Berry is a 61 y.o. female  who presents to [place] today, 01/03/17,  for chief complaint of:  Chief Complaint  Patient presents with  . Annual Exam    Patient here for annual physical / wellness exam.  See preventive care reviewed as below.  Recent labs reviewed in detail with the patient.   Additional concerns today include:  None   Past medical, surgical, social and family history reviewed: Patient Active Problem List   Diagnosis Date Noted  . 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 Family history of melanoma 03/19/2014  . TMJ arthralgia 10/01/2013  . Osteopenia 09/29/2013  . Personal history of colonic polyps 09/29/2013  . Prediabetes 09/29/2013  . Hyperlipidemia 09/29/2013   Past Surgical History:  Procedure Laterality Date  . TUBAL LIGATION  1985   Social History  Substance Use Topics  . Smoking status: Never Smoker  . Smokeless tobacco: Not on file  . Alcohol use Yes   Family History  Problem Relation Age of Onset  . Alcoholism Unknown        brother  . Cancer Mother   . Hypertension Mother      Current medication list and allergy/intolerance information reviewed:   Current Outpatient Prescriptions  Medication Sig Dispense Refill  . Calcium Carbonate-Vitamin D (CALCIUM + D PO) Take by mouth.    . Multiple Vitamins-Minerals (MULTIVITAMIN PO) Take by mouth.     No current facility-administered medications for this visit.    No Known Allergies    Review of Systems:  Constitutional:  No  fever, no chills, No recent illness, No unintentional weight changes. No significant fatigue.   HEENT: No  headache, no vision change, no hearing change,  Cardiac: No  chest pain, No  pressure, No palpitations,  Respiratory:  No  shortness of breath. No  Cough  Gastrointestinal: No  abdominal pain, No  nausea, No  vomiting,  No  blood in stool, No  diarrhea, No  constipation   Musculoskeletal: No new  myalgia/arthralgia  Skin: No  Rash, No other wounds/concerning lesions  Hem/Onc: No  easy bruising/bleeding  Endocrine: No cold intolerance,  No heat intolerance.   Neurologic: No  weakness, No  dizziness  Psychiatric: No  concerns with depression, No  concerns with anxiety, No sleep problems, No mood problems  Exam:  BP 127/78   Pulse 80   Ht 5\' 5"  (1.651 m)   Wt 132 lb (59.9 kg)   BMI 21.97 kg/m   Constitutional: VS see above. General Appearance: alert, well-developed, well-nourished, NAD  Eyes: Normal lids and conjunctive, non-icteric sclera  Ears, Nose, Mouth, Throat: MMM, Normal external inspection ears/nares/mouth/lips/gums. TM normal bilaterally. Pharynx/tonsils no erythema, no exudate. Nasal mucosa normal.   Neck: No masses, trachea midline. No thyroid enlargement. No tenderness/mass appreciated. No lymphadenopathy  Respiratory: Normal respiratory effort. no wheeze, no rhonchi, no rales  Cardiovascular: S1/S2 normal, no murmur, no rub/gallop auscultated. RRR. No lower extremity edema.   Gastrointestinal: Nontender, no masses. No hepatomegaly, no splenomegaly. No hernia appreciated. Bowel sounds normal. Rectal exam deferred.   Musculoskeletal: Gait normal. No clubbing/cyanosis of digits.   Neurological: Normal balance/coordination. No tremor. No cranial nerve deficit on limited exam. Motor and sensation intact and symmetric. Cerebellar reflexes intact.   Skin: warm, dry, intact. No rash/ulcer. No concerning nevi or subq nodules on limited exam.    Psychiatric: Normal judgment/insight. Normal mood and affect. Oriented x3.  GYN: No lesions/ulcers to external genitalia, normal urethra, normal  vaginal mucosa, physiologic discharge, cervix normal with small polyp noted, uterus not enlarged or tender, adnexa no masses and nontender  BREAST: No rashes/skin changes, normal fibrous breast tissue, no masses or tenderness, normal nipple without discharge, normal  axilla    ASSESSMENT/PLAN:   Annual physical exam - Plan: CBC, COMPLETE METABOLIC PANEL WITH GFR, Lipid panel, TSH, VITAMIN D 25 Hydroxy (Vit-D Deficiency, Fractures)  Screening for colon cancer - Plan: Ambulatory referral to Gastroenterology  History of colon polyps - Plan: Ambulatory referral to Gastroenterology  Osteopenia, unspecified location - Plan: DG Bone Density, VITAMIN D 25 Hydroxy (Vit-D Deficiency, Fractures)  Breast cancer screening - Plan: MM DIGITAL SCREENING BILATERAL  Cervical cancer screening - Plan: Cytology - PAP  Prediabetes - Plan: Hemoglobin A1c   Patient Instructions  Plan: Mammogram - due - can get this downstairs Bone Density test - due - can get this downstairs  Pap - done today - results should be back in one week Labs - screening cholesterol, diabetes, thyroid, etc Colonoscopy - due - referral placed Shingles shot - recommended for people over 60 - Rx printed Flu shot annually - recommended  If all tests are normal, come see me in a year unless you need me sooner!     FEMALE PREVENTIVE CARE Updated 01/03/17   ANNUAL SCREENING/COUNSELING  Diet/Exercise - HEALTHY HABITS DISCUSSED TO DECREASE CV RISK History  Smoking Status  . Never Smoker  Smokeless Tobacco  . Never Used   History  Alcohol Use  . Yes   Depression screen PHQ 2/9 01/03/2017  Decreased Interest 0  Down, Depressed, Hopeless 0  PHQ - 2 Score 0    Domestic violence concerns - no  HTN SCREENING - SEE [place]  Sexually active in the past year - Yes with female.  Need/want STI testing today? - no  Concerns about libido or pain with sex? - no  Plans for pregnancy? - n/a  INFECTIOUS DISEASE SCREENING  HIV - needs - declined  GC/CT - does not need  HepC - DOB [birth year] - needs - declined  TB - does not need  DISEASE SCREENING  Lipid - needs  DM2 - needs  Osteoporosis - needs - hc osteopenia  CANCER SCREENING  Cervical - needs  Breast  - needs  Lung - does not need  Colon - needs  ADULT VACCINATION  Influenza - annual vaccine recommended  Td - booster every 10 years   Zoster - option at 50, yes at 60+   PCV13 - was not indicated  PPSV23 - was not indicated Immunization History  Administered Date(s) Administered  . Influenza,inj,Quad PF,36+ Mos 03/19/2014  . Tdap 07/05/2009  . Zoster 06/11/2014   OTHER  Fall - exercise and Vit D age 40+ - does not need  Consider ASA - age 60-59 - does not need     Visit summary with medication list and pertinent instructions was printed for patient to review. All questions at time of visit were answered - patient instructed to contact office with any additional concerns. ER/RTC precautions were reviewed with the patient. Follow-up plan: Return in about 1 year (around 01/03/2018) for [facility], sooner if needed .

## 2017-01-03 NOTE — Patient Instructions (Addendum)
Plan: Mammogram - due - can get this downstairs Bone Density test - due - can get this downstairs  Pap - done today - results should be back in one week Labs - screening cholesterol, diabetes, thyroid, etc Colonoscopy - due - referral placed Shingles shot - recommended for people over 60 - Rx printed Flu shot annually - recommended  If all tests are normal, come see me in a year unless you need me sooner!

## 2017-01-08 LAB — CYTOLOGY - PAP
Diagnosis: NEGATIVE
HPV: NOT DETECTED

## 2017-01-09 ENCOUNTER — Ambulatory Visit (INDEPENDENT_AMBULATORY_CARE_PROVIDER_SITE_OTHER): Payer: Federal, State, Local not specified - PPO

## 2017-01-09 DIAGNOSIS — Z1231 Encounter for screening mammogram for malignant neoplasm of breast: Secondary | ICD-10-CM | POA: Diagnosis not present

## 2017-01-09 DIAGNOSIS — M8588 Other specified disorders of bone density and structure, other site: Secondary | ICD-10-CM

## 2017-01-09 DIAGNOSIS — Z0000 Encounter for general adult medical examination without abnormal findings: Secondary | ICD-10-CM | POA: Diagnosis not present

## 2017-01-09 DIAGNOSIS — M85852 Other specified disorders of bone density and structure, left thigh: Secondary | ICD-10-CM | POA: Diagnosis not present

## 2017-01-09 DIAGNOSIS — M8580 Other specified disorders of bone density and structure, unspecified site: Secondary | ICD-10-CM | POA: Diagnosis not present

## 2017-01-09 DIAGNOSIS — R7303 Prediabetes: Secondary | ICD-10-CM | POA: Diagnosis not present

## 2017-01-09 DIAGNOSIS — M8589 Other specified disorders of bone density and structure, multiple sites: Secondary | ICD-10-CM | POA: Diagnosis not present

## 2017-01-09 LAB — CBC
HCT: 41.7 % (ref 35.0–45.0)
Hemoglobin: 13 g/dL (ref 11.7–15.5)
MCH: 26.7 pg — ABNORMAL LOW (ref 27.0–33.0)
MCHC: 31.2 g/dL — ABNORMAL LOW (ref 32.0–36.0)
MCV: 85.8 fL (ref 80.0–100.0)
MPV: 9.3 fL (ref 7.5–12.5)
Platelets: 262 10*3/uL (ref 140–400)
RBC: 4.86 MIL/uL (ref 3.80–5.10)
RDW: 13.6 % (ref 11.0–15.0)
WBC: 5.6 10*3/uL (ref 3.8–10.8)

## 2017-01-10 LAB — COMPLETE METABOLIC PANEL WITH GFR
ALT: 6 U/L (ref 6–29)
AST: 12 U/L (ref 10–35)
Albumin: 4.1 g/dL (ref 3.6–5.1)
Alkaline Phosphatase: 58 U/L (ref 33–130)
BUN: 11 mg/dL (ref 7–25)
CO2: 28 mmol/L (ref 20–31)
CREATININE: 0.82 mg/dL (ref 0.50–0.99)
Calcium: 9.1 mg/dL (ref 8.6–10.4)
Chloride: 106 mmol/L (ref 98–110)
GFR, Est African American: 88 mL/min (ref >=60)
GFR, Est Non African American: 76 mL/min (ref >=60)
GLUCOSE: 84 mg/dL (ref 65–99)
POTASSIUM: 4.3 mmol/L (ref 3.5–5.3)
Sodium: 142 mmol/L (ref 135–146)
Total Bilirubin: 0.5 mg/dL (ref 0.2–1.2)
Total Protein: 6.2 g/dL (ref 6.1–8.1)

## 2017-01-10 LAB — LIPID PANEL
Cholesterol: 195 mg/dL (ref <200)
HDL: 63 mg/dL (ref >50)
LDL Cholesterol: 120 mg/dL — ABNORMAL HIGH (ref <100)
Total CHOL/HDL Ratio: 3.1 Ratio (ref <5.0)
Triglycerides: 60 mg/dL (ref <150)
VLDL: 12 mg/dL (ref <30)

## 2017-01-10 LAB — HEMOGLOBIN A1C
HEMOGLOBIN A1C: 5.3 % (ref <5.7)
Mean Plasma Glucose: 105 mg/dL

## 2017-01-10 LAB — TSH: TSH: 1.59 mIU/L

## 2017-01-10 LAB — VITAMIN D 25 HYDROXY (VIT D DEFICIENCY, FRACTURES): Vit D, 25-Hydroxy: 32 ng/mL (ref 30–100)

## 2017-02-20 ENCOUNTER — Encounter: Payer: Self-pay | Admitting: Gastroenterology

## 2017-04-23 ENCOUNTER — Ambulatory Visit: Payer: Federal, State, Local not specified - PPO | Admitting: Gastroenterology

## 2017-04-25 DIAGNOSIS — K080 Exfoliation of teeth due to systemic causes: Secondary | ICD-10-CM | POA: Diagnosis not present

## 2017-12-05 DIAGNOSIS — K080 Exfoliation of teeth due to systemic causes: Secondary | ICD-10-CM | POA: Diagnosis not present

## 2018-04-18 ENCOUNTER — Telehealth: Payer: Self-pay | Admitting: Osteopathic Medicine

## 2018-04-18 DIAGNOSIS — R7303 Prediabetes: Secondary | ICD-10-CM

## 2018-04-18 DIAGNOSIS — E785 Hyperlipidemia, unspecified: Secondary | ICD-10-CM

## 2018-04-18 DIAGNOSIS — M8580 Other specified disorders of bone density and structure, unspecified site: Secondary | ICD-10-CM

## 2018-04-18 DIAGNOSIS — Z0000 Encounter for general adult medical examination without abnormal findings: Secondary | ICD-10-CM

## 2018-04-18 NOTE — Telephone Encounter (Signed)
Labs pended for PCP approval

## 2018-04-18 NOTE — Telephone Encounter (Signed)
Patient is scheduled for a physical on 12/3 and would like to have labs done prior to appointment on 11/21. Please place orders.

## 2018-04-21 NOTE — Telephone Encounter (Signed)
Orders are signed.

## 2018-04-21 NOTE — Telephone Encounter (Signed)
Left VM with status update.  

## 2018-05-01 DIAGNOSIS — K080 Exfoliation of teeth due to systemic causes: Secondary | ICD-10-CM | POA: Diagnosis not present

## 2018-05-05 ENCOUNTER — Other Ambulatory Visit: Payer: Self-pay | Admitting: Osteopathic Medicine

## 2018-05-05 DIAGNOSIS — Z1239 Encounter for other screening for malignant neoplasm of breast: Secondary | ICD-10-CM

## 2018-05-06 LAB — TSH: TSH: 1.23 m[IU]/L (ref 0.40–4.50)

## 2018-05-06 LAB — COMPREHENSIVE METABOLIC PANEL
AG Ratio: 2.4 (calc) (ref 1.0–2.5)
ALT: 7 U/L (ref 6–29)
AST: 13 U/L (ref 10–35)
Albumin: 4 g/dL (ref 3.6–5.1)
Alkaline phosphatase (APISO): 62 U/L (ref 33–130)
BUN: 15 mg/dL (ref 7–25)
CO2: 28 mmol/L (ref 20–32)
CREATININE: 0.77 mg/dL (ref 0.50–0.99)
Calcium: 9.1 mg/dL (ref 8.6–10.4)
Chloride: 106 mmol/L (ref 98–110)
Globulin: 1.7 g/dL (calc) — ABNORMAL LOW (ref 1.9–3.7)
Glucose, Bld: 77 mg/dL (ref 65–99)
Potassium: 4 mmol/L (ref 3.5–5.3)
Sodium: 143 mmol/L (ref 135–146)
TOTAL PROTEIN: 5.7 g/dL — AB (ref 6.1–8.1)
Total Bilirubin: 0.4 mg/dL (ref 0.2–1.2)

## 2018-05-06 LAB — CBC
HCT: 36.7 % (ref 35.0–45.0)
Hemoglobin: 11.7 g/dL (ref 11.7–15.5)
MCH: 26.6 pg — ABNORMAL LOW (ref 27.0–33.0)
MCHC: 31.9 g/dL — AB (ref 32.0–36.0)
MCV: 83.4 fL (ref 80.0–100.0)
MPV: 10 fL (ref 7.5–12.5)
PLATELETS: 262 10*3/uL (ref 140–400)
RBC: 4.4 10*6/uL (ref 3.80–5.10)
RDW: 12.7 % (ref 11.0–15.0)
WBC: 4.8 10*3/uL (ref 3.8–10.8)

## 2018-05-06 LAB — HEMOGLOBIN A1C
EAG (MMOL/L): 6.2 (calc)
Hgb A1c MFr Bld: 5.5 % of total Hgb (ref <5.7)
MEAN PLASMA GLUCOSE: 111 (calc)

## 2018-05-06 LAB — LIPID PANEL
CHOL/HDL RATIO: 3.3 (calc) (ref <5.0)
Cholesterol: 187 mg/dL (ref <200)
HDL: 57 mg/dL (ref >50)
LDL Cholesterol (Calc): 115 mg/dL (calc) — ABNORMAL HIGH
NON-HDL CHOLESTEROL (CALC): 130 mg/dL — AB (ref <130)
TRIGLYCERIDES: 60 mg/dL (ref <150)

## 2018-05-13 ENCOUNTER — Telehealth: Payer: Self-pay | Admitting: Osteopathic Medicine

## 2018-05-13 ENCOUNTER — Encounter: Payer: Self-pay | Admitting: Osteopathic Medicine

## 2018-05-13 ENCOUNTER — Ambulatory Visit (INDEPENDENT_AMBULATORY_CARE_PROVIDER_SITE_OTHER): Payer: Federal, State, Local not specified - PPO | Admitting: Osteopathic Medicine

## 2018-05-13 VITALS — BP 137/89 | HR 73 | Temp 98.1°F | Ht 64.25 in | Wt 135.0 lb

## 2018-05-13 DIAGNOSIS — Z0000 Encounter for general adult medical examination without abnormal findings: Secondary | ICD-10-CM | POA: Diagnosis not present

## 2018-05-13 DIAGNOSIS — H00011 Hordeolum externum right upper eyelid: Secondary | ICD-10-CM

## 2018-05-13 DIAGNOSIS — Z860100 Personal history of colon polyps, unspecified: Secondary | ICD-10-CM

## 2018-05-13 DIAGNOSIS — Z1211 Encounter for screening for malignant neoplasm of colon: Secondary | ICD-10-CM

## 2018-05-13 MED ORDER — ERYTHROMYCIN 5 MG/GM OP OINT: 1.0000 "application " | TOPICAL_OINTMENT | Freq: Three times a day (TID) | OPHTHALMIC | 0 refills | Status: AC

## 2018-05-13 NOTE — Patient Instructions (Addendum)
General Preventive Care  Most recent routine screening lipids/other labs: ordered today. Cholesterol and Diabetes screening usually recommended annually.   Everyone should have blood pressure checked once per year.   Tobacco: don't! Alcohol: responsible moderation is ok for most adults  Exercise: as tolerated to reduce risk of cardiovascular disease and diabetes. Strength training will also prevent osteoporosis.   Mental health: if need for mental health care (medicines, counseling, other), or concerns about moods, please let me know!   Sexual health: if need for STD testing, or if concerns with libido/pain problems, please let me know! Vaccines  Flu vaccine: thanks for getting a flu shot this year!   Shingles vaccine: Shingrix recommended after age 26 (old Zostavax already done) - will put you on the list   Pneumonia vaccines: Prevnar and Pneumovax recommended after age 17  Tetanus booster: Tdap recommended every 10 years - due 06/2019 Cancer screenings   Colon cancer screening: last colonoscopy on file from 2011 recommended follow-up in 5 years, will re-send referral to GI   Breast cancer screening: mammogram recommended annually after age 56. Keep scheduled appointment for this later this month.   Cervical cancer screening: Let's repeat Pap at age 20 and if it's normal, you're done!   Lung cancer screening: not needed if never smoker  Infection screenings . HIV: recommended screening at least once age 12-65, or as needed  Hepatitis C: recommended once for anyone born 24-1967 . Gonorrhea/Chlamydia: screening as needed . TB: certain at-risk populations, or depending on work requirements and/or travel history Other . Bone Density Test: recommended for women at age 16 . Advanced Directive: Living Will and/or Healthcare Power of Attorney recommended for all adults, regardless of age or health!          Over-the-Counter Medications & Home Remedies for Upper Respiratory  Illness  Aches/Pains, Fever, Headache Acetaminophen (Tylenol) 500 mg tablets - take max 2 tablets (1000 mg) every 6 hours (4 times per day)  Ibuprofen (Motrin) 200 mg tablets - take max 4 tablets (800 mg) every 6 hours*  Sinus Congestion Nasal Saline if desired Oxymetolazone (Afrin, others) sparing use due to rebound congestion, NEVER use in kids Phenylephrine (Sudafed) 10 mg tablets every 4 hours (or the 12-hour formulation)* Diphenhydramine (Benadryl) 25 mg tablets - take max 2 tablets every 4 hours  Cough & Sore Throat Dextromethorphan (Robitussin, others) - cough suppressant Guaifenesin (Robitussin, Mucinex, others) - expectorant (helps cough up mucus) (Dextromethorphan and Guaifenesin also come in a combination tablet) Lozenges w/ Benzocaine + Menthol (Cepacol) Honey - as much as you want! Teas which "coat the throat" - look for ingredients Elm Bark, Licorice Root, Marshmallow Root  Other Zinc Lozenges within 24 hours of symptoms onset - mixed evidence this shortens the duration of the common cold Don't waste your money on Vitamin C or Echinacea  *Caution in patients with high blood pressure

## 2018-05-13 NOTE — Telephone Encounter (Signed)
-----   Message from Emeterio Reeve, DO sent at 05/13/2018  3:33 PM EST ----- Regarding: shingles listtt shingrix list!

## 2018-05-13 NOTE — Progress Notes (Signed)
HPI: Carolyn Berry is a 62 y.o. female who  has no past medical history on file.  she presents to [facility] today, 05/13/18,  for chief complaint of: Annual physical     Patient here for annual physical / wellness exam.  See preventive care reviewed as below.  Recent labs reviewed in detail with the patient.   Additional concerns today include:   Would like referral to dermatologist for skin check, (+)FH melanoma   R upper eyelid ?stye - swelling and sore 4 days ago, worse 3 days ago, alternating heat/cold compresses and it's improving but not resolved       Past medical, surgical, social and family history reviewed:  Patient Active Problem List   Diagnosis Date Noted  . 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 Family history of melanoma 03/19/2014  . TMJ arthralgia 10/01/2013  . Osteopenia 09/29/2013  . Personal history of colonic polyps 09/29/2013  . Prediabetes 09/29/2013  . Hyperlipidemia 09/29/2013    Past Surgical History:  Procedure Laterality Date  . skin tumor removal ?lipoma - benign    . TUBAL LIGATION  1985    Social History   Tobacco Use  . Smoking status: Never Smoker  . Smokeless tobacco: Never Used  Substance Use Topics  . Alcohol use: Yes    Comment: rare use, social     Family History  Problem Relation Age of Onset  . Alcoholism Unknown        brother  . Cancer Mother   . Hypertension Mother   . Melanoma Mother      Current medication list and allergy/intolerance information reviewed:    Current Outpatient Medications  Medication Sig Dispense Refill  . Calcium Carbonate-Vitamin D (CALCIUM + D PO) Take by mouth.    . Multiple Vitamins-Minerals (MULTIVITAMIN PO) Take by mouth.     No current facility-administered medications for this visit.     No Known Allergies    Review of Systems:  Constitutional:  No  fever, no chills, No recent illness, No unintentional weight changes. No significant fatigue.    HEENT: No  headache, no vision change, no hearing change, No sore throat, No  sinus pressure  Cardiac: No  chest pain, No  pressure, No palpitations, No  Orthopnea  Respiratory:  No  shortness of breath. No  Cough  Gastrointestinal: No  abdominal pain, No  nausea, No  vomiting,  No  blood in stool, No  diarrhea, No  constipation   Musculoskeletal: No new myalgia/arthralgia  Skin: No  Rash, No other wounds, +concerning lesions  Genitourinary: No  incontinence, No  abnormal genital bleeding, No abnormal genital discharge  Hem/Onc: No  easy bruising/bleeding, No  abnormal lymph node  Endocrine: No cold intolerance,  No heat intolerance. No polyuria/polydipsia/polyphagia   Neurologic: No  weakness, No  dizziness, No  slurred speech/focal weakness/facial droop  Psychiatric: No  concerns with depression, No  concerns with anxiety, No sleep problems, No mood problems  Exam:  BP 137/89   Pulse 73   Temp 98.1 F (36.7 C) (Oral)   Ht 5' 4.25" (1.632 m)   Wt 135 lb (61.2 kg)   BMI 22.99 kg/m   Constitutional: VS see above. General Appearance: alert, well-developed, well-nourished, NAD  Eyes: Normal conjunctive, non-icteric sclera, EOMI, PERRL. R upper   Ears, Nose, Mouth, Throat: MMM, Normal external inspection ears/nares/mouth/lips/gums. TM normal bilaterally. Pharynx/tonsils no erythema, no exudate. Nasal mucosa normal.   Neck: No masses, trachea midline. No  thyroid enlargement. No tenderness/mass appreciated. No lymphadenopathy  Respiratory: Normal respiratory effort. no wheeze, no rhonchi, no rales  Cardiovascular: S1/S2 normal, no murmur, no rub/gallop auscultated. RRR. No lower extremity edema.  Gastrointestinal: Nontender, no masses. No hepatomegaly, no splenomegaly. No hernia appreciated. Bowel sounds normal. Rectal exam deferred.   Musculoskeletal: Gait normal. No clubbing/cyanosis of digits.   Neurological: Normal balance/coordination. No tremor. No cranial nerve  deficit on limited exam. Motor and sensation intact and symmetric. Cerebellar reflexes intact.   Skin: warm, dry, intact. No rash/ulcer. No concerning nevi or subq nodules on limited exam.    Psychiatric: Normal judgment/insight. Normal mood and affect. Oriented x3.   BP Readings from Last 3 Encounters:  05/13/18 137/89  01/03/17 127/78  10/19/15 (!) 129/91        ASSESSMENT/PLAN: The primary encounter diagnosis was Annual physical exam. A diagnosis of Hordeolum externum of right upper eyelid was also pertinent to this visit.   Meds ordered this encounter  Medications  . erythromycin ophthalmic ointment    Sig: Place 1 application into the right eye 3 (three) times daily for 5 days. Apply 1 inch ribbon to affected eye TID for 5 days.    Dispense:  3.5 g    Refill:  0    Immunization History  Administered Date(s) Administered  . Influenza,inj,Quad PF,6+ Mos 03/19/2014  . Influenza-Unspecified 03/24/2018  . Tdap 07/05/2009  . Zoster 06/11/2014     Patient Instructions  General Preventive Care  Most recent routine screening lipids/other labs: ordered today. Cholesterol and Diabetes screening usually recommended annually.   Everyone should have blood pressure checked once per year.   Tobacco: don't! Alcohol: responsible moderation is ok for most adults  Exercise: as tolerated to reduce risk of cardiovascular disease and diabetes. Strength training will also prevent osteoporosis.   Mental health: if need for mental health care (medicines, counseling, other), or concerns about moods, please let me know!   Sexual health: if need for STD testing, or if concerns with libido/pain problems, please let me know! Vaccines  Flu vaccine: thanks for getting a flu shot this year!   Shingles vaccine: Shingrix recommended after age 9 (old Zostavax already done) - will put you on the list   Pneumonia vaccines: Prevnar and Pneumovax recommended after age 30  Tetanus booster: Tdap  recommended every 10 years - due 06/2019 Cancer screenings   Colon cancer screening: last colonoscopy on file from 2011 recommended follow-up in 5 years, will re-send referral to GI   Breast cancer screening: mammogram recommended annually after age 63. Keep scheduled appointment for this later this month.   Cervical cancer screening: Let's repeat Pap at age 1 and if it's normal, you're done!   Lung cancer screening: not needed if never smoker  Infection screenings . HIV: recommended screening at least once age 12-65, or as needed  Hepatitis C: recommended once for anyone born 65-1967 . Gonorrhea/Chlamydia: screening as needed . TB: certain at-risk populations, or depending on work requirements and/or travel history Other . Bone Density Test: recommended for women at age 69 . Advanced Directive: Living Will and/or Healthcare Power of Attorney recommended for all adults, regardless of age or health!              Visit summary with medication list and pertinent instructions was printed for patient to review. All questions at time of visit were answered - patient instructed to contact office with any additional concerns or updates. ER/RTC precautions were reviewed with the patient.  Please note: voice recognition software was used to produce this document, and typos may escape review. Please contact Dr. Sheppard Coil for any needed clarifications.     Follow-up plan: Return in about 1 year (around 05/14/2019) for [facility] - sooner if needed.

## 2018-05-13 NOTE — Telephone Encounter (Signed)
Added

## 2018-05-15 ENCOUNTER — Ambulatory Visit (INDEPENDENT_AMBULATORY_CARE_PROVIDER_SITE_OTHER): Payer: Federal, State, Local not specified - PPO

## 2018-05-15 DIAGNOSIS — Z1231 Encounter for screening mammogram for malignant neoplasm of breast: Secondary | ICD-10-CM

## 2018-05-15 DIAGNOSIS — Z1239 Encounter for other screening for malignant neoplasm of breast: Secondary | ICD-10-CM

## 2018-06-17 ENCOUNTER — Ambulatory Visit (INDEPENDENT_AMBULATORY_CARE_PROVIDER_SITE_OTHER): Payer: Federal, State, Local not specified - PPO | Admitting: Osteopathic Medicine

## 2018-06-17 VITALS — BP 148/94 | HR 88 | Temp 98.5°F

## 2018-06-17 DIAGNOSIS — Z23 Encounter for immunization: Secondary | ICD-10-CM | POA: Diagnosis not present

## 2018-06-17 NOTE — Progress Notes (Signed)
Pt came into clinic today for shingles vaccine. Went over possible side effects in great detail. Tolerated injection in right deltoid well, no immediate complications. BP was slightly elevated. Did let her sit prior to recheck, but when I came back to recheck she was doing arm exercises so the "shot doesn't make her sore." No further questions or concerns. Advised to schedule next Shingrix NV in 2 months.

## 2018-06-24 ENCOUNTER — Encounter: Payer: Self-pay | Admitting: Osteopathic Medicine

## 2018-06-26 ENCOUNTER — Telehealth: Payer: Self-pay | Admitting: Gastroenterology

## 2018-06-26 NOTE — Telephone Encounter (Signed)
Received referral for patient to have next colon here. Spoke to patient who states last colon was in 2011. Patient not requesting certain MD. DOD for referral date 73.4.2019 is Dr.Beavers. Records in Epic and also placed on desk for review.

## 2018-06-26 NOTE — Telephone Encounter (Signed)
Thank you. Where can I find these records for review?

## 2018-07-09 NOTE — Telephone Encounter (Signed)
Thank you. I reviewed the procedure note from 2011. Repeat exam recommended in 5 years at that time. May schedule a colonoscopy at her convenience. Thank you.

## 2018-07-09 NOTE — Telephone Encounter (Signed)
Records were printed but also colonoscopy report is in epic from 5.6.2011 under the procedure tab. No path report.

## 2018-08-18 ENCOUNTER — Ambulatory Visit (INDEPENDENT_AMBULATORY_CARE_PROVIDER_SITE_OTHER): Payer: Federal, State, Local not specified - PPO | Admitting: Osteopathic Medicine

## 2018-08-18 VITALS — BP 138/84 | HR 76 | Temp 98.2°F | Wt 137.0 lb

## 2018-08-18 DIAGNOSIS — Z23 Encounter for immunization: Secondary | ICD-10-CM | POA: Diagnosis not present

## 2018-08-18 NOTE — Progress Notes (Signed)
Pt in today for shingrix vaccine. This is 2 of 2 of the shingrix series. Vitals taken and no fever noted. Vaccine was given in left deltoid. Pt tolerated well with no immediate complications.

## 2018-09-03 ENCOUNTER — Encounter: Payer: Self-pay | Admitting: Osteopathic Medicine

## 2019-04-14 ENCOUNTER — Telehealth: Payer: Self-pay | Admitting: Osteopathic Medicine

## 2019-04-14 DIAGNOSIS — M8580 Other specified disorders of bone density and structure, unspecified site: Secondary | ICD-10-CM

## 2019-04-14 DIAGNOSIS — Z0000 Encounter for general adult medical examination without abnormal findings: Secondary | ICD-10-CM

## 2019-04-14 DIAGNOSIS — E785 Hyperlipidemia, unspecified: Secondary | ICD-10-CM

## 2019-04-14 DIAGNOSIS — R7303 Prediabetes: Secondary | ICD-10-CM

## 2019-04-14 NOTE — Telephone Encounter (Signed)
Patient states that she could have it anytime this year, as long as it was before the end of the year. She is aware of her last physical. Left voicemail for patient to ask about medicare. She had only told me she had [employer] but called to verify.

## 2019-04-14 NOTE — Telephone Encounter (Signed)
Is patient aware her last physical was 05/13/18 and it has not been a full year?   Also- just want to verify she is not medicare?

## 2019-04-14 NOTE — Telephone Encounter (Signed)
Patient would like lab work sent in for physical. States she needs to have the physical done before the end of the year. Also wanted to know if an order can be put in for a mammogram. Please advise.

## 2019-04-14 NOTE — Telephone Encounter (Signed)
Thanks for checking!!  Labs pended for Dr A to review and sign off on

## 2019-04-30 ENCOUNTER — Encounter: Payer: Self-pay | Admitting: Osteopathic Medicine

## 2019-04-30 ENCOUNTER — Ambulatory Visit (INDEPENDENT_AMBULATORY_CARE_PROVIDER_SITE_OTHER): Payer: Federal, State, Local not specified - PPO | Admitting: Osteopathic Medicine

## 2019-04-30 ENCOUNTER — Other Ambulatory Visit: Payer: Self-pay

## 2019-04-30 ENCOUNTER — Other Ambulatory Visit: Payer: Self-pay | Admitting: Osteopathic Medicine

## 2019-04-30 ENCOUNTER — Other Ambulatory Visit (HOSPITAL_COMMUNITY)
Admission: RE | Admit: 2019-04-30 | Discharge: 2019-04-30 | Disposition: A | Discharge status: Home / Self Care | Payer: Federal, State, Local not specified - PPO | Source: Ambulatory Visit | Attending: Osteopathic Medicine | Admitting: Osteopathic Medicine

## 2019-04-30 VITALS — BP 141/97 | HR 94 | Temp 97.6°F | Wt 133.1 lb

## 2019-04-30 DIAGNOSIS — Z0000 Encounter for general adult medical examination without abnormal findings: Secondary | ICD-10-CM | POA: Diagnosis not present

## 2019-04-30 DIAGNOSIS — Z124 Encounter for screening for malignant neoplasm of cervix: Secondary | ICD-10-CM | POA: Diagnosis not present

## 2019-04-30 DIAGNOSIS — Z1231 Encounter for screening mammogram for malignant neoplasm of breast: Secondary | ICD-10-CM

## 2019-04-30 DIAGNOSIS — R7303 Prediabetes: Secondary | ICD-10-CM | POA: Diagnosis not present

## 2019-04-30 DIAGNOSIS — E7800 Pure hypercholesterolemia, unspecified: Secondary | ICD-10-CM | POA: Diagnosis not present

## 2019-04-30 DIAGNOSIS — E785 Hyperlipidemia, unspecified: Secondary | ICD-10-CM | POA: Diagnosis not present

## 2019-04-30 DIAGNOSIS — Z23 Encounter for immunization: Secondary | ICD-10-CM | POA: Diagnosis not present

## 2019-04-30 DIAGNOSIS — Z808 Family history of malignant neoplasm of other organs or systems: Secondary | ICD-10-CM

## 2019-04-30 DIAGNOSIS — Z13820 Encounter for screening for osteoporosis: Secondary | ICD-10-CM

## 2019-04-30 DIAGNOSIS — Z1211 Encounter for screening for malignant neoplasm of colon: Secondary | ICD-10-CM

## 2019-04-30 DIAGNOSIS — M8580 Other specified disorders of bone density and structure, unspecified site: Secondary | ICD-10-CM | POA: Diagnosis not present

## 2019-04-30 NOTE — Progress Notes (Signed)
HPI: Carolyn Berry is a 63 y.o. female who  has no past medical history on file.  she presents to [facility] today, 04/30/19,  for chief complaint of: Annual physical    Patient here for annual physical / wellness exam.  See preventive care reviewed as below.   Additional concerns today include:  None!     Past medical, surgical, social and family history reviewed:  Patient Active Problem List   Diagnosis Date Noted  . 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 Family history of melanoma 03/19/2014  . TMJ arthralgia 10/01/2013  . Osteopenia 09/29/2013  . Personal history of colonic polyps 09/29/2013  . Prediabetes 09/29/2013  . Hyperlipidemia 09/29/2013    Past Surgical History:  Procedure Laterality Date  . skin tumor removal ?lipoma - benign    . TUBAL LIGATION  1985    Social History   Tobacco Use  . Smoking status: Never Smoker  . Smokeless tobacco: Never Used  Substance Use Topics  . Alcohol use: Yes    Comment: rare use, social     Family History  Problem Relation Age of Onset  . Alcoholism Unknown        brother  . Cancer Mother   . Hypertension Mother   . Melanoma Mother      Current medication list and allergy/intolerance information reviewed:    Current Outpatient Medications  Medication Sig Dispense Refill  . Calcium Carbonate-Vitamin D (CALCIUM + D PO) Take by mouth.    . Multiple Vitamins-Minerals (MULTIVITAMIN PO) Take by mouth.     No current facility-administered medications for this visit.     No Known Allergies    Review of Systems:  Constitutional:  No  fever, no chills, No recent illness, No unintentional weight changes. No significant fatigue.   HEENT: No  headache, no vision change  Cardiac: No  chest pain, No  pressure, No palpitations, No  Orthopnea  Respiratory:  No  shortness of breath. No  Cough  Gastrointestinal: No  abdominal pain, No  nausea, No  vomiting,  No  blood in stool, No   diarrhea, No  constipation   Musculoskeletal: No new myalgia/arthralgia  Skin: No  Rash, +other wounds/concerning lesions   Genitourinary: No  incontinence, No  abnormal genital bleeding, No abnormal genital discharge  Hem/Onc: No  easy bruising/bleeding  Endocrine: No cold intolerance,  No heat intolerance. No polyuria/polydipsia/polyphagia   Neurologic: No  weakness, No  dizziness  Psychiatric: No  concerns with depression, No  concerns with anxiety, No sleep problems, No mood problems  Exam:  BP (!) 141/97 (BP Location: Left Arm, Patient Position: Sitting, Cuff Size: Normal)   Pulse 94   Temp 97.6 F (36.4 C) (Oral)   Wt 133 lb 1.3 oz (60.4 kg)   BMI 22.67 kg/m   Constitutional: VS see above. General Appearance: alert, well-developed, well-nourished, NAD  Eyes: Normal lids and conjunctive, non-icteric sclera  Neck: No masses, trachea midline. No thyroid enlargement. No tenderness/mass appreciated. No lymphadenopathy  Respiratory: Normal respiratory effort. no wheeze, no rhonchi, no rales  Cardiovascular: S1/S2 normal, no murmur, no rub/gallop auscultated. RRR. No lower extremity edema.   Gastrointestinal: Nontender, no masses.  Musculoskeletal: Gait normal. No clubbing/cyanosis of digits.   Neurological: Normal balance/coordination. No tremor. No cranial nerve deficit on limited exam. Motor and sensation intact and symmetric. Cerebellar reflexes intact.   Skin: warm, dry, intact. No rash/ulcer. No concerning nevi or subq nodules on limited exam.  Few seborrheic  keratoses   Psychiatric: Normal judgment/insight. Normal mood and affect. Oriented x3.  GYN: No lesions/ulcers to external genitalia, normal urethra, normal vaginal mucosa, physiologic discharge, cervix normal without lesions, uterus not enlarged or tender, adnexa no masses and nontender   No results found for this or any previous visit (from the past 36 hour(s)).  No results found.        ASSESSMENT/PLAN: The primary encounter diagnosis was Annual physical exam. Diagnoses of Cervical cancer screening, Osteoporosis screening, Colon cancer screening, Encounter for screening mammogram for malignant neoplasm of breast, Need for 23-polyvalent pneumococcal polysaccharide vaccine, Family history of melanoma, and Need for pneumococcal vaccination were also pertinent to this visit.   Orders Placed This Encounter  Procedures  . DG Bone Density  . Pneumococcal polysaccharide vaccine 23-valent greater than or equal to 2yo subcutaneous/IM  . Ambulatory referral to Gastroenterology  . Ambulatory referral to Dermatology    No orders of the defined types were placed in this encounter.  Patient Instructions  General Preventive Care  Most recent routine screening lipids/other labs: will call once I have results!  Everyone should have blood pressure checked once per year - goal 130 top number and 80 bottom number or less.   Tobacco: don't!   Alcohol: responsible moderation is ok for most adults  Exercise: as tolerated to reduce risk of cardiovascular disease and diabetes. Strength training will also prevent osteoporosis.   Mental health: if need for mental health care (medicines, counseling, other), or concerns about moods, please let me know!   Sexual health: if ever need for STD testing, or if concerns with libido/pain problems, please let me know! Vaccines  Flu vaccine: thanks for getting a flu shot this year!   Shingles vaccine: Shingrix all done!   Pneumonia vaccines: Pneumovax recommended after age 74 - will get this today!   Tetanus booster: Tdap recommended every 10 years  Cancer screenings   Colon cancer screening: last colonoscopy on file from 2011 recommended follow-up in 5 years, will re-send referral to GI   Breast cancer screening: mammogram recommended annually after age 36. Keep scheduled appointment for 05/2019  Cervical cancer screening: Let's  repeat Pap today since we are at age 42 and if it's normal, you're done!   Lung cancer screening: not needed if never smoker  Infection screenings  HIV: recommended screening at least once age 47-65, or as needed  Hepatitis C: recommended once for anyone born 80-1967  Gonorrhea/Chlamydia: screening as needed  TB: certain at-risk populations, or depending on work requirements and/or travel history Other  Bone Density Test: recommended for women at age 49 - ordered!   Advanced Directive: Living Will and/or Healthcare Power of Attorney recommended for all adults, regardless of age or health! See printed information and let me know if any questions!          Visit summary with medication list and pertinent instructions was printed for patient to review. All questions at time of visit were answered - patient instructed to contact office with any additional concerns or updates. ER/RTC precautions were reviewed with the patient.   Please note: voice recognition software was used to produce this document, and typos may escape review. Please contact Dr. Sheppard Coil for any needed clarifications.     Follow-up plan: Return for around a year for [place] before 05/2020.

## 2019-04-30 NOTE — Patient Instructions (Addendum)
General Preventive Care  Most recent routine screening lipids/other labs: will call once I have results!  Everyone should have blood pressure checked once per year - goal 130 top number and 80 bottom number or less.   Tobacco: don't!   Alcohol: responsible moderation is ok for most adults  Exercise: as tolerated to reduce risk of cardiovascular disease and diabetes. Strength training will also prevent osteoporosis.   Mental health: if need for mental health care (medicines, counseling, other), or concerns about moods, please let me know!   Sexual health: if ever need for STD testing, or if concerns with libido/pain problems, please let me know! Vaccines  Flu vaccine: thanks for getting a flu shot this year!   Shingles vaccine: Shingrix all done!   Pneumonia vaccines: Pneumovax recommended after age 19 - will get this today!   Tetanus booster: Tdap recommended every 10 years  Cancer screenings   Colon cancer screening: last colonoscopy on file from 2011 recommended follow-up in 5 years, will re-send referral to GI   Breast cancer screening: mammogram recommended annually after age 64. Keep scheduled appointment for 05/2019  Cervical cancer screening: Let's repeat Pap today since we are at age 55 and if it's normal, you're done!   Lung cancer screening: not needed if never smoker  Infection screenings  HIV: recommended screening at least once age 44-65, or as needed  Hepatitis C: recommended once for anyone born 60-1967  Gonorrhea/Chlamydia: screening as needed  TB: certain at-risk populations, or depending on work requirements and/or travel history Other  Bone Density Test: recommended for women at age 22 - ordered!   Advanced Directive: Living Will and/or Healthcare Power of Attorney recommended for all adults, regardless of age or health! See printed information and let me know if any questions!

## 2019-05-01 LAB — COMPLETE METABOLIC PANEL WITH GFR
AG Ratio: 2.3 (calc) (ref 1.0–2.5)
ALT: 9 U/L (ref 6–29)
AST: 14 U/L (ref 10–35)
Albumin: 4.4 g/dL (ref 3.6–5.1)
Alkaline phosphatase (APISO): 59 U/L (ref 37–153)
BUN: 17 mg/dL (ref 7–25)
CO2: 29 mmol/L (ref 20–32)
Calcium: 9.7 mg/dL (ref 8.6–10.4)
Chloride: 105 mmol/L (ref 98–110)
Creat: 0.85 mg/dL (ref 0.50–0.99)
GFR, Est African American: 83 mL/min/{1.73_m2} (ref >=60)
GFR, Est Non African American: 72 mL/min/{1.73_m2} (ref >=60)
Globulin: 1.9 g/dL (calc) (ref 1.9–3.7)
Glucose, Bld: 86 mg/dL (ref 65–99)
Potassium: 4.3 mmol/L (ref 3.5–5.3)
Sodium: 143 mmol/L (ref 135–146)
Total Bilirubin: 0.6 mg/dL (ref 0.2–1.2)
Total Protein: 6.3 g/dL (ref 6.1–8.1)

## 2019-05-01 LAB — CBC
HCT: 44.1 % (ref 35.0–45.0)
Hemoglobin: 14.2 g/dL (ref 11.7–15.5)
MCH: 27.3 pg (ref 27.0–33.0)
MCHC: 32.2 g/dL (ref 32.0–36.0)
MCV: 84.6 fL (ref 80.0–100.0)
MPV: 10 fL (ref 7.5–12.5)
Platelets: 259 10*3/uL (ref 140–400)
RBC: 5.21 10*6/uL — ABNORMAL HIGH (ref 3.80–5.10)
RDW: 12.2 % (ref 11.0–15.0)
WBC: 7.1 10*3/uL (ref 3.8–10.8)

## 2019-05-01 LAB — LIPID PANEL W/REFLEX DIRECT LDL
Cholesterol: 207 mg/dL — ABNORMAL HIGH (ref <200)
HDL: 61 mg/dL (ref >=50)
LDL Cholesterol (Calc): 128 mg/dL (calc) — ABNORMAL HIGH
Non-HDL Cholesterol (Calc): 146 mg/dL (calc) — ABNORMAL HIGH (ref <130)
Total CHOL/HDL Ratio: 3.4 (calc) (ref <5.0)
Triglycerides: 82 mg/dL (ref <150)

## 2019-05-06 ENCOUNTER — Other Ambulatory Visit: Payer: Self-pay

## 2019-05-06 ENCOUNTER — Ambulatory Visit (INDEPENDENT_AMBULATORY_CARE_PROVIDER_SITE_OTHER): Payer: Federal, State, Local not specified - PPO | Admitting: Osteopathic Medicine

## 2019-05-06 ENCOUNTER — Ambulatory Visit (INDEPENDENT_AMBULATORY_CARE_PROVIDER_SITE_OTHER): Payer: Federal, State, Local not specified - PPO

## 2019-05-06 VITALS — BP 130/85 | HR 76 | Temp 98.3°F | Wt 133.0 lb

## 2019-05-06 DIAGNOSIS — Z0000 Encounter for general adult medical examination without abnormal findings: Secondary | ICD-10-CM

## 2019-05-06 DIAGNOSIS — M810 Age-related osteoporosis without current pathological fracture: Secondary | ICD-10-CM | POA: Diagnosis not present

## 2019-05-06 DIAGNOSIS — Z13820 Encounter for screening for osteoporosis: Secondary | ICD-10-CM

## 2019-05-06 DIAGNOSIS — Z780 Asymptomatic menopausal state: Secondary | ICD-10-CM | POA: Diagnosis not present

## 2019-05-06 DIAGNOSIS — R030 Elevated blood-pressure reading, without diagnosis of hypertension: Secondary | ICD-10-CM | POA: Diagnosis not present

## 2019-05-06 DIAGNOSIS — M85852 Other specified disorders of bone density and structure, left thigh: Secondary | ICD-10-CM | POA: Diagnosis not present

## 2019-05-06 NOTE — Progress Notes (Signed)
Pt in office today for BP check. Pt's BP at last visit was elevated, pt stated she was anxious. Today patients bp is 130/85 and pulse 76.Advised pt I would forward message to provider and if any changes we will notify patient.

## 2019-05-11 DIAGNOSIS — D225 Melanocytic nevi of trunk: Secondary | ICD-10-CM | POA: Diagnosis not present

## 2019-05-11 DIAGNOSIS — D1801 Hemangioma of skin and subcutaneous tissue: Secondary | ICD-10-CM | POA: Diagnosis not present

## 2019-05-11 DIAGNOSIS — L814 Other melanin hyperpigmentation: Secondary | ICD-10-CM | POA: Diagnosis not present

## 2019-05-11 DIAGNOSIS — L821 Other seborrheic keratosis: Secondary | ICD-10-CM | POA: Diagnosis not present

## 2019-05-11 LAB — CYTOLOGY - PAP
Comment: NEGATIVE
Comment: NEGATIVE
Diagnosis: NEGATIVE
HPV 16: NEGATIVE
HPV 18 / 45: NEGATIVE
High risk HPV: POSITIVE — AB

## 2019-05-11 NOTE — Progress Notes (Signed)
BP Readings from Last 3 Encounters:  05/06/19 130/85  04/30/19 (!) 141/97  08/18/18 138/84   BP improved I think we are ok to continue to monitor at least every 6 mos

## 2019-05-21 ENCOUNTER — Ambulatory Visit: Payer: Federal, State, Local not specified - PPO

## 2019-05-21 ENCOUNTER — Ambulatory Visit (INDEPENDENT_AMBULATORY_CARE_PROVIDER_SITE_OTHER): Payer: Federal, State, Local not specified - PPO

## 2019-05-21 ENCOUNTER — Other Ambulatory Visit: Payer: Self-pay

## 2019-05-21 DIAGNOSIS — Z1231 Encounter for screening mammogram for malignant neoplasm of breast: Secondary | ICD-10-CM

## 2019-05-22 ENCOUNTER — Other Ambulatory Visit: Payer: Self-pay | Admitting: Osteopathic Medicine

## 2019-05-22 DIAGNOSIS — R928 Other abnormal and inconclusive findings on diagnostic imaging of breast: Secondary | ICD-10-CM

## 2019-05-26 ENCOUNTER — Ambulatory Visit
Admission: RE | Admit: 2019-05-26 | Discharge: 2019-05-26 | Disposition: A | Discharge status: Home / Self Care | Payer: Federal, State, Local not specified - PPO | Source: Ambulatory Visit | Attending: Osteopathic Medicine | Admitting: Osteopathic Medicine

## 2019-05-26 ENCOUNTER — Other Ambulatory Visit: Payer: Self-pay

## 2019-05-26 ENCOUNTER — Other Ambulatory Visit: Payer: Self-pay | Admitting: Osteopathic Medicine

## 2019-05-26 DIAGNOSIS — N6489 Other specified disorders of breast: Secondary | ICD-10-CM | POA: Diagnosis not present

## 2019-05-26 DIAGNOSIS — R928 Other abnormal and inconclusive findings on diagnostic imaging of breast: Secondary | ICD-10-CM

## 2019-05-26 DIAGNOSIS — N6320 Unspecified lump in the left breast, unspecified quadrant: Secondary | ICD-10-CM | POA: Diagnosis not present

## 2019-06-03 ENCOUNTER — Ambulatory Visit
Admission: RE | Admit: 2019-06-03 | Discharge: 2019-06-03 | Disposition: A | Discharge status: Home / Self Care | Payer: Federal, State, Local not specified - PPO | Source: Ambulatory Visit | Attending: Osteopathic Medicine | Admitting: Osteopathic Medicine

## 2019-06-03 ENCOUNTER — Other Ambulatory Visit: Payer: Self-pay

## 2019-06-03 DIAGNOSIS — N6320 Unspecified lump in the left breast, unspecified quadrant: Secondary | ICD-10-CM | POA: Diagnosis not present

## 2019-06-03 DIAGNOSIS — N6489 Other specified disorders of breast: Secondary | ICD-10-CM

## 2019-06-03 DIAGNOSIS — R928 Other abnormal and inconclusive findings on diagnostic imaging of breast: Secondary | ICD-10-CM | POA: Diagnosis not present

## 2019-06-03 DIAGNOSIS — N6012 Diffuse cystic mastopathy of left breast: Secondary | ICD-10-CM | POA: Diagnosis not present

## 2019-06-08 ENCOUNTER — Encounter: Payer: Self-pay | Admitting: Osteopathic Medicine

## 2019-06-08 ENCOUNTER — Telehealth: Payer: Self-pay

## 2019-06-08 NOTE — Telephone Encounter (Signed)
Pt left a vm msg requesting a diagnostic mammogram referral for a 6 mths f/u. As per pt, breast biopsy was negative for breast cancer. She was recommended to follow up every 6 mths. Pt is requesting referral be placed with Imaging at our location. Pls advise, thanks.

## 2019-06-08 NOTE — Telephone Encounter (Signed)
I've been told we DO NOT do diagnostic mammo or ultrasound downstairs, I think the breast ctr misspoke

## 2019-06-08 NOTE — Telephone Encounter (Signed)
[place] reminder set for 6 mos to order it

## 2019-06-08 NOTE — Telephone Encounter (Signed)
Left a detailed vm msg for pt regarding provider's note. Direct call back info provided.  

## 2019-06-08 NOTE — Telephone Encounter (Signed)
As per pt, she spoke with someone in Imaging today. She did explain her situation and was informed that a diagnostic mammogram can be completed at our location. Pls advise, thanks.

## 2019-06-08 NOTE — Telephone Encounter (Signed)
Breast center will contact her when she is due!

## 2019-06-08 NOTE — Telephone Encounter (Signed)
Pt returned a call back stating she was contacted by the Breast center today. Pt asked the Breast center scheduler if it would be ok for her to have the testing done at our facility in [place] instead of [place]. She was informed that was ok for her to repeat testing at our location. Needs referral to have diagnostic mammogram to be done downstairs. Thanks.

## 2019-07-06 ENCOUNTER — Encounter: Payer: Self-pay | Admitting: Osteopathic Medicine

## 2019-10-14 ENCOUNTER — Telehealth: Payer: Self-pay

## 2019-10-14 NOTE — Telephone Encounter (Signed)
Carolyn Berry scheduled for a CPE this month. She states her insurance will cover a physical and blood work yearly. She states isn't every 12 months but once a year. She would like lab orders.

## 2019-10-20 ENCOUNTER — Other Ambulatory Visit: Payer: Self-pay

## 2019-10-20 DIAGNOSIS — Z0000 Encounter for general adult medical examination without abnormal findings: Secondary | ICD-10-CM

## 2019-10-20 DIAGNOSIS — R7303 Prediabetes: Secondary | ICD-10-CM

## 2019-10-20 DIAGNOSIS — E785 Hyperlipidemia, unspecified: Secondary | ICD-10-CM

## 2019-10-20 NOTE — Progress Notes (Signed)
Pt called requesting annual labs. Task completed. Pt has been updated of lab order.

## 2019-10-22 DIAGNOSIS — R7303 Prediabetes: Secondary | ICD-10-CM | POA: Diagnosis not present

## 2019-10-22 DIAGNOSIS — E785 Hyperlipidemia, unspecified: Secondary | ICD-10-CM | POA: Diagnosis not present

## 2019-10-22 DIAGNOSIS — Z0000 Encounter for general adult medical examination without abnormal findings: Secondary | ICD-10-CM | POA: Diagnosis not present

## 2019-10-23 LAB — COMPLETE METABOLIC PANEL WITH GFR
AG Ratio: 2.4 (calc) (ref 1.0–2.5)
ALT: 8 U/L (ref 6–29)
AST: 12 U/L (ref 10–35)
Albumin: 4.4 g/dL (ref 3.6–5.1)
Alkaline phosphatase (APISO): 53 U/L (ref 37–153)
BUN: 14 mg/dL (ref 7–25)
CO2: 31 mmol/L (ref 20–32)
Calcium: 9 mg/dL (ref 8.6–10.4)
Chloride: 105 mmol/L (ref 98–110)
Creat: 0.73 mg/dL (ref 0.50–0.99)
GFR, Est African American: 100 mL/min/{1.73_m2} (ref >=60)
GFR, Est Non African American: 86 mL/min/{1.73_m2} (ref >=60)
Globulin: 1.8 g/dL (calc) — ABNORMAL LOW (ref 1.9–3.7)
Glucose, Bld: 93 mg/dL (ref 65–99)
Potassium: 4.6 mmol/L (ref 3.5–5.3)
Sodium: 141 mmol/L (ref 135–146)
Total Bilirubin: 0.5 mg/dL (ref 0.2–1.2)
Total Protein: 6.2 g/dL (ref 6.1–8.1)

## 2019-10-23 LAB — CBC
HCT: 43.5 % (ref 35.0–45.0)
Hemoglobin: 14.2 g/dL (ref 11.7–15.5)
MCH: 27.2 pg (ref 27.0–33.0)
MCHC: 32.6 g/dL (ref 32.0–36.0)
MCV: 83.3 fL (ref 80.0–100.0)
MPV: 9.9 fL (ref 7.5–12.5)
Platelets: 249 10*3/uL (ref 140–400)
RBC: 5.22 10*6/uL — ABNORMAL HIGH (ref 3.80–5.10)
RDW: 12.7 % (ref 11.0–15.0)
WBC: 5.4 10*3/uL (ref 3.8–10.8)

## 2019-10-23 LAB — LIPID PANEL
Cholesterol: 192 mg/dL (ref <200)
HDL: 55 mg/dL (ref >=50)
LDL Cholesterol (Calc): 120 mg/dL (calc) — ABNORMAL HIGH
Non-HDL Cholesterol (Calc): 137 mg/dL (calc) — ABNORMAL HIGH (ref <130)
Total CHOL/HDL Ratio: 3.5 (calc) (ref <5.0)
Triglycerides: 74 mg/dL (ref <150)

## 2019-10-23 LAB — HEMOGLOBIN A1C
Hgb A1c MFr Bld: 5.5 % of total Hgb (ref <5.7)
Mean Plasma Glucose: 111 (calc)
eAG (mmol/L): 6.2 (calc)

## 2019-11-02 ENCOUNTER — Other Ambulatory Visit: Payer: Self-pay

## 2019-11-02 ENCOUNTER — Encounter: Payer: Self-pay | Admitting: Osteopathic Medicine

## 2019-11-02 ENCOUNTER — Ambulatory Visit (INDEPENDENT_AMBULATORY_CARE_PROVIDER_SITE_OTHER): Payer: Federal, State, Local not specified - PPO | Admitting: Osteopathic Medicine

## 2019-11-02 VITALS — BP 147/94 | HR 80 | Temp 98.5°F | Wt 133.1 lb

## 2019-11-02 DIAGNOSIS — Z1211 Encounter for screening for malignant neoplasm of colon: Secondary | ICD-10-CM

## 2019-11-02 DIAGNOSIS — Z0000 Encounter for general adult medical examination without abnormal findings: Secondary | ICD-10-CM

## 2019-11-02 DIAGNOSIS — R0600 Dyspnea, unspecified: Secondary | ICD-10-CM

## 2019-11-02 DIAGNOSIS — Z1231 Encounter for screening mammogram for malignant neoplasm of breast: Secondary | ICD-10-CM | POA: Diagnosis not present

## 2019-11-02 DIAGNOSIS — I10 Essential (primary) hypertension: Secondary | ICD-10-CM

## 2019-11-02 DIAGNOSIS — R0609 Other forms of dyspnea: Secondary | ICD-10-CM

## 2019-11-02 MED ORDER — AMBULATORY NON FORMULARY MEDICATION: 99 refills | Status: AC

## 2019-11-02 NOTE — Patient Instructions (Addendum)
General Preventive Care  Most recent routine screening labs: done!   Goal BP 140/90 or less, ideally 130/80 or less  Tobacco: don't!   Alcohol: responsible moderation is ok for most adults - if you have concerns about your alcohol intake, please talk to me!   Exercise: as tolerated to reduce risk of cardiovascular disease and diabetes. Strength training will also prevent osteoporosis.   Mental health: if need for mental health care (medicines, counseling, other), or concerns about moods, please let me know!   Sexual health: if need for STD testing, or if concerns with libido/pain problems, please let me know!   Advanced Directive: Living Will and/or Healthcare Power of Attorney recommended for all adults, regardless of age or health.  Vaccines  Flu vaccine: for almost everyone, every fall.   Shingles vaccine: all done!  Pneumonia vaccine: done!  Tetanus booster: every 10 years - due - ask pharmacy!   COVID vaccine: thanks for getting your vaccine!  Cancer screenings   Colon cancer screening: for everyone age 59-75. Colonoscopy  Was due 2015!  Breast cancer screening: mammogram annually age 43-75  Cervical cancer screening: due 04/2020  Lung cancer screening: not needed for non-smokers  Infection screenings  . HIV: recommended screening at least once age 24-65, more often as needed. . Gonorrhea/Chlamydia: screening as needed . Hepatitis C: recommended once for everyone age 92-75 . TB: certain at-risk folks Other . Bone Density Test: every other year - due 04/2021

## 2019-11-02 NOTE — Progress Notes (Signed)
Carolyn Berry is a 64 y.o. female who presents to  [place] at [facility]  today, 11/02/19, seeking care for the following: . Annual Physical      ASSESSMENT & PLAN with other pertinent history/findings:  The primary encounter diagnosis was Annual physical exam. Diagnoses of Colon cancer screening, Encounter for screening mammogram for malignant neoplasm of breast, and Essential hypertension were also pertinent to this visit.   Having some shortness of breath w/ exertion, no risk factors for respiratory illness, no CP, pt understandably anxious about htis, otherwise healthy - refer to cardio    Patient Instructions  General Preventive Care  Most recent routine screening labs: done!   Goal BP 140/90 or less, ideally 130/80 or less  Tobacco: don't!   Alcohol: responsible moderation is ok for most adults - if you have concerns about your alcohol intake, please talk to me!   Exercise: as tolerated to reduce risk of cardiovascular disease and diabetes. Strength training will also prevent osteoporosis.   Mental health: if need for mental health care (medicines, counseling, other), or concerns about moods, please let me know!   Sexual health: if need for STD testing, or if concerns with libido/pain problems, please let me know!   Advanced Directive: Living Will and/or Healthcare Power of Attorney recommended for all adults, regardless of age or health.  Vaccines  Flu vaccine: for almost everyone, every fall.   Shingles vaccine: all done!  Pneumonia vaccine: done!  Tetanus booster: every 10 years - due - ask pharmacy!   COVID vaccine: thanks for getting your vaccine!  Cancer screenings   Colon cancer screening: for everyone age 89-75. Colonoscopy  Was due 2015!  Breast cancer screening: mammogram annually age 44-75  Cervical cancer screening: due 04/2020  Lung cancer screening: not needed for non-smokers  Infection  screenings  . HIV: recommended screening at least once age 81-65, more often as needed. . Gonorrhea/Chlamydia: screening as needed . Hepatitis C: recommended once for everyone age 2-75 . TB: certain at-risk folks Other . Bone Density Test: every other year - due 04/2021    Orders Placed This Encounter  Procedures  . MM Digital Diagnostic Unilat L  . Ambulatory referral to Gastroenterology    Meds ordered this encounter  Medications  . AMBULATORY NON FORMULARY MEDICATION    Sig: BP machine - cuff around arm. Dx I10    Dispense:  1 Device    Refill:  99       Follow-up instructions: Return for Pap follow-up. .                                         BP (!) 147/94 (BP Location: Left Arm, Patient Position: Sitting, Cuff Size: Normal)   Pulse 80   Temp 98.5 F (36.9 C) (Oral)   Wt 133 lb 1.9 oz (60.4 kg)   BMI 22.67 kg/m   Current Meds  Medication Sig  . Calcium Carbonate-Vitamin D (CALCIUM + D PO) Take by mouth.  . Multiple Vitamins-Minerals (MULTIVITAMIN PO) Take by mouth.    No results found for this or any previous visit (from the past 72 hour(s)).  No results found.  Depression screen [facility] 2/9 11/02/2019 04/30/2019 05/13/2018  Decreased Interest 0 3 0  Down, Depressed, Hopeless 0 0 0  PHQ - 2 Score 0 3 0  Altered sleeping 0 2 -  Tired, decreased energy 0 0 -  Change in appetite 0 0 -  Feeling bad or failure about yourself  0 0 -  Trouble concentrating 0 0 -  Moving slowly or fidgety/restless 0 0 -  Suicidal thoughts 0 0 -  PHQ-9 Score 0 5 -  Difficult doing work/chores - Somewhat difficult -    GAD 7 : Generalized Anxiety Score 11/02/2019 04/30/2019  Nervous, Anxious, on Edge 1 0  Control/stop worrying 0 0  Worry too much - different things 0 0  Trouble relaxing 1 0  Restless 0 0  Easily annoyed or irritable 1 0  Afraid - awful might happen 0 0  Total GAD 7 Score 3 0  Anxiety Difficulty Not difficult at all Not  difficult at all    Constitutional:  . VSS, see nurse notes . General Appearance: alert, well-developed, well-nourished, NAD Neck: . No masses, trachea midline Respiratory: . Normal respiratory effort . No dullness/hyper-resonance to percussion . Breath sounds normal, no wheeze/rhonchi/rales Cardiovascular: . S1/S2 normal, no murmur/rub/gallop auscultated . No lower extremity edema Musculoskeletal:  . Gait normal . No clubbing/cyanosis of digits Neurological: . No cranial nerve deficit on limited exam . Motor and sensation intact and symmetric Psychiatric: . Normal judgment/insight . Normal mood and affect   All questions at time of visit were answered - patient instructed to contact office with any additional concerns or updates.  ER/RTC precautions were reviewed with the patient.  Please note: voice recognition software was used to produce this document, and typos may escape review. Please contact Dr. Sheppard Coil for any needed clarifications.

## 2019-11-18 NOTE — Progress Notes (Signed)
Cardiology Office Note:    Date:  11/20/2019   ID:  Carolyn Berry, DOB [birth year], MRN [id number]  PCP:  Emeterio Reeve, DO  Cardiologist:  Shirlee More, MD   Referring MD: Emeterio Reeve, DO  ASSESSMENT:    1. Essential hypertension   2. SOB (shortness of breath) on exertion   3. Palpitations    PLAN:    In order of problems listed above:  1. I told her I am comfortable looking her readings at home and in the office that she has hypertension and after discussion of benefits we will start an ARB for antihypertensive therapy continue to check blood pressure at home doing all readings in the right arm and follow-up in the office in several weeks to assess response goal is a systolic of less than 938 and diastolics of 80 or less. 2. Her symptoms are not prominent I do not think she requires an ischemia evaluation with normal EKG and exam 3. 7-day ZIO monitor to assess for arrhythmia.  Next appointment 3 to 4 weeks to evaluate her response to antihypertensive therapy and monitor report   Medication Adjustments/Labs and Tests Ordered: Current medicines are reviewed at length with the patient today.  Concerns regarding medicines are outlined above.  Orders Placed This Encounter  Procedures  . LONG TERM MONITOR (3-14 DAYS)  . EKG 12-Lead   Meds ordered this encounter  Medications  . telmisartan (MICARDIS) 40 MG tablet    Sig: Take 1 tablet (40 mg total) by mouth daily.    Dispense:  90 tablet    Refill:  3     Chief Complaint  Patient presents with  . New Patient (Initial Visit)  . Shortness of Breath    History of Present Illness:    Carolyn Berry is a 64 y.o. female who is being seen today for the evaluation of elevated blood pressure readings and palpitation at the request of Emeterio Reeve, DO.  She has been noted to have intermittent elevation of blood pressure and since November has been tracking her home blood pressure which typically runs less  than 101 systolic but runs 75-102 diastolic.  Is been checking blood pressure left arm.  Her last office visit with her primary care physician her blood pressure is significantly elevated.  Today in my office she is 164/98 on the right 162/96 on the left but she has an auscultatory gap on that side which may be influencing the home blood pressure she takes in her left arm.  She is very health-conscious vigorous active and purposely climbs multiple flights of stairs per day.  She says when she finishes climbing multiple flights of stairs she is a little breathless but she does not think it is anything more than it should be for her activity level.  No edema orthopnea or syncope.  She has brief episodes of palpitation which she describes as fluttering and she wonders if she may have atrial fibrillation.  She is not having exertional chest pain.  She has no history of murmur congenital rheumatic heart disease.  The episodes of palpitation happen several times a day. Past Medical History:  Diagnosis Date  . Elevated blood-pressure reading without diagnosis of hypertension     Past Surgical History:  Procedure Laterality Date  . skin tumor removal ?lipoma - benign    . TUBAL LIGATION  1985    Current Medications: Current Meds  Medication Sig  . AMBULATORY NON FORMULARY MEDICATION BP machine - cuff around arm. Dx I10  .  Calcium Carbonate-Vitamin D (CALCIUM + D PO) Take by mouth.  . Multiple Vitamins-Minerals (MULTIVITAMIN PO) Take by mouth.     Allergies:   Patient has no known allergies.   Social History   Socioeconomic History  . Marital status: Married    Spouse name: Not on file  . Number of children: Not on file  . Years of education: Not on file  . Highest education level: Not on file  Occupational History  . Not on file  Tobacco Use  . Smoking status: Never Smoker  . Smokeless tobacco: Never Used  Vaping Use  . Vaping Use: Never used  Substance and Sexual Activity  . Alcohol  use: Yes    Comment: rare use, social   . Drug use: No  . Sexual activity: Yes    Partners: Male  Other Topics Concern  . Not on file  Social History Narrative  . Not on file   Social Determinants of Health   Financial Resource Strain:   . Difficulty of Paying Living Expenses:   Food Insecurity:   . Worried About Charity fundraiser in the Last Year:   . Arboriculturist in the Last Year:   Transportation Needs:   . Film/video editor (Medical):   Marland Kitchen Lack of Transportation (Non-Medical):   Physical Activity:   . Days of Exercise per Week:   . Minutes of Exercise per Session:   Stress:   . Feeling of Stress :   Social Connections:   . Frequency of Communication with Friends and Family:   . Frequency of Social Gatherings with Friends and Family:   . Attends Religious Services:   . Active Member of Clubs or Organizations:   . Attends Archivist Meetings:   Marland Kitchen Marital Status:      Family History: The patient's family history includes Alcoholism in an other family member; Cancer in her mother; Hypertension in her mother; Melanoma in her mother.  ROS:   Review of Systems  Constitutional: Negative.  HENT: Negative.   Eyes: Negative.   Cardiovascular: Positive for palpitations.  Respiratory: Positive for shortness of breath.   Endocrine: Negative.   Hematologic/Lymphatic: Negative.   Skin: Negative.   Musculoskeletal: Negative.   Gastrointestinal: Negative.   Genitourinary: Negative.   Neurological: Negative.   Psychiatric/Behavioral: Negative.   Allergic/Immunologic: Negative.    Please see the history of present illness.     All other systems reviewed and are negative.  EKGs/Labs/Other Studies Reviewed:    The following studies were reviewed today:   EKG:  EKG is  ordered today.  The ekg ordered today is personally reviewed and demonstrates sinus rhythm normal  Recent Labs: 10/22/2019: ALT 8; BUN 14; Creat 0.73; Hemoglobin 14.2; Platelets 249;  Potassium 4.6; Sodium 141  Recent Lipid Panel    Component Value Date/Time   CHOL 192 10/22/2019 0818   TRIG 74 10/22/2019 0818   HDL 55 10/22/2019 0818   CHOLHDL 3.5 10/22/2019 0818   VLDL 12 01/09/2017 0911   LDLCALC 120 (H) 10/22/2019 0818    Physical Exam:    VS:  BP (!) 148/104   Pulse 70   Ht 5' 4.25" (1.632 m)   Wt 130 lb (59 kg)   SpO2 98%   BMI 22.14 kg/m     Wt Readings from Last 3 Encounters:  11/20/19 130 lb (59 kg)  11/02/19 133 lb 1.9 oz (60.4 kg)  05/06/19 133 lb (60.3 kg)  GEN:  Well nourished, well developed in no acute distress HEENT: Normal NECK: No JVD; No carotid bruits LYMPHATICS: No lymphadenopathy CARDIAC: RRR, no murmurs, rubs, gallops RESPIRATORY:  Clear to auscultation without rales, wheezing or rhonchi  ABDOMEN: Soft, non-tender, non-distended MUSCULOSKELETAL:  No edema; No deformity  SKIN: Warm and dry NEUROLOGIC:  Alert and oriented x 3 PSYCHIATRIC:  Normal affect     Signed, Shirlee More, MD  11/20/2019 4:26 PM    [place]

## 2019-11-20 ENCOUNTER — Ambulatory Visit: Payer: Federal, State, Local not specified - PPO | Admitting: Cardiology

## 2019-11-20 ENCOUNTER — Other Ambulatory Visit: Payer: Self-pay

## 2019-11-20 ENCOUNTER — Ambulatory Visit (INDEPENDENT_AMBULATORY_CARE_PROVIDER_SITE_OTHER): Payer: Federal, State, Local not specified - PPO

## 2019-11-20 ENCOUNTER — Encounter: Payer: Self-pay | Admitting: Cardiology

## 2019-11-20 VITALS — BP 148/104 | HR 70 | Ht 64.25 in | Wt 130.0 lb

## 2019-11-20 DIAGNOSIS — I10 Essential (primary) hypertension: Secondary | ICD-10-CM | POA: Diagnosis not present

## 2019-11-20 DIAGNOSIS — R002 Palpitations: Secondary | ICD-10-CM | POA: Diagnosis not present

## 2019-11-20 DIAGNOSIS — R0602 Shortness of breath: Secondary | ICD-10-CM | POA: Diagnosis not present

## 2019-11-20 MED ORDER — TELMISARTAN 40 MG PO TABS: 40.0000 mg | ORAL_TABLET | Freq: Every day | ORAL | 3 refills | Status: DC

## 2019-11-20 NOTE — Patient Instructions (Signed)
Medication Instructions:  Your physician has recommended you make the following change in your medication:  START: Telmisartan 40 mg take one tablet by mouth daily.  *If you need a refill on your cardiac medications before your next appointment, please call your pharmacy*   Lab Work: None If you have labs (blood work) drawn today and your tests are completely normal, you will receive your results only by:  [place] (if you have MyChart) OR  A paper copy in the mail If you have any lab test that is abnormal or we need to change your treatment, we will call you to review the results.   Testing/Procedures: A zio monitor was ordered today. It will remain on for 7 days. You will then return monitor and event diary in provided box. It takes 1-2 weeks for report to be downloaded and returned to [place]. We will call you with the results. If monitor falls off or has orange flashing light, please call Zio for further instructions.     Follow-Up: At [facility], you and your health needs are our priority.  As part of our continuing mission to provide you with exceptional heart care, we have created designated Provider Care Teams.  These Care Teams include your primary Cardiologist (physician) and Advanced Practice Providers (APPs -  Physician Assistants and Nurse Practitioners) who all work together to provide you with the care you need, when you need it.  We recommend signing up for the patient portal called "MyChart".  Sign up information is provided on this After Visit Summary.  MyChart is used to connect with patients for Virtual Visits (Telemedicine).  Patients are able to view lab/test results, encounter notes, upcoming appointments, etc.  Non-urgent messages can be sent to your provider as well.   To learn more about what you can do with MyChart, go to [email or web address].    Your next appointment:   3-4 month(s)  The format for your next appointment:   In Person  Provider:    Shirlee More, MD or Berniece Salines, MD   Other Instructions

## 2019-11-23 ENCOUNTER — Encounter: Payer: Self-pay | Admitting: Gastroenterology

## 2019-11-23 ENCOUNTER — Other Ambulatory Visit: Payer: Self-pay | Admitting: Osteopathic Medicine

## 2019-11-23 DIAGNOSIS — Z1231 Encounter for screening mammogram for malignant neoplasm of breast: Secondary | ICD-10-CM

## 2019-11-24 ENCOUNTER — Ambulatory Visit: Payer: Federal, State, Local not specified - PPO | Admitting: Osteopathic Medicine

## 2019-11-24 ENCOUNTER — Other Ambulatory Visit: Payer: Self-pay

## 2019-11-24 ENCOUNTER — Encounter: Payer: Self-pay | Admitting: Osteopathic Medicine

## 2019-11-24 VITALS — BP 159/91 | HR 80 | Temp 98.4°F | Ht 64.5 in | Wt 132.0 lb

## 2019-11-24 DIAGNOSIS — M7918 Myalgia, other site: Secondary | ICD-10-CM | POA: Diagnosis not present

## 2019-11-24 DIAGNOSIS — I10 Essential (primary) hypertension: Secondary | ICD-10-CM

## 2019-11-24 DIAGNOSIS — M778 Other enthesopathies, not elsewhere classified: Secondary | ICD-10-CM

## 2019-11-24 MED ORDER — DICLOFENAC SODIUM 1 % EX GEL: 4.0000 g | Freq: Four times a day (QID) | CUTANEOUS | 11 refills | Status: DC

## 2019-11-24 NOTE — Progress Notes (Signed)
Carolyn Berry is a 63 y.o. female who presents to  [place] at [facility]  today, 11/24/19, seeking care for the following: . HTN - seen by cardiology, started ARB, goal BP 130/80 or less. Not started Rx yet. Home BP still above goal  . Fall this weekend from her grandson's pogo stick, she landed on L hand and L hip/buttock. (+)bruising and some      ASSESSMENT & PLAN with other pertinent history/findings:  The primary encounter diagnosis was Essential hypertension. Diagnoses of Tendinitis of left wrist and Left buttock pain were also pertinent to this visit.  Conservative measures for orthopedic injury, no fracture suspected, (+)pain w/ resistance to thumb abduction but ROM otherwise ok and strength ok, neg finkelstein's. (+)tendelenberg    There are no Patient Instructions on file for this visit.   No orders of the defined types were placed in this encounter.   Meds ordered this encounter  Medications  . diclofenac Sodium (VOLTAREN) 1 % GEL    Sig: Apply 4 g topically 4 (four) times daily. To affected joint.    Dispense:  150 g    Refill:  11       Follow-up instructions: Return for RECHECK PENDING BP RESULTS / IF WORSE OR CHANGE.                                         BP (!) 159/91   Pulse 80   Temp 98.4 F (36.9 C) (Oral)   Ht 5' 4.5" (1.638 m)   Wt 132 lb (59.9 kg)   SpO2 100% Comment: on RA  BMI 22.31 kg/m   Current Meds  Medication Sig  . AMBULATORY NON FORMULARY MEDICATION BP machine - cuff around arm. Dx I10  . Calcium Carbonate-Vitamin D (CALCIUM + D PO) Take by mouth.  . Multiple Vitamins-Minerals (MULTIVITAMIN PO) Take by mouth.    No results found for this or any previous visit (from the past 72 hour(s)).  No results found.  Depression screen [facility] 2/9 11/02/2019 04/30/2019 05/13/2018  Decreased Interest 0 3 0  Down, Depressed, Hopeless 0 0 0  PHQ - 2 Score 0  3 0  Altered sleeping 0 2 -  Tired, decreased energy 0 0 -  Change in appetite 0 0 -  Feeling bad or failure about yourself  0 0 -  Trouble concentrating 0 0 -  Moving slowly or fidgety/restless 0 0 -  Suicidal thoughts 0 0 -  PHQ-9 Score 0 5 -  Difficult doing work/chores - Somewhat difficult -    GAD 7 : Generalized Anxiety Score 11/02/2019 04/30/2019  Nervous, Anxious, on Edge 1 0  Control/stop worrying 0 0  Worry too much - different things 0 0  Trouble relaxing 1 0  Restless 0 0  Easily annoyed or irritable 1 0  Afraid - awful might happen 0 0  Total GAD 7 Score 3 0  Anxiety Difficulty Not difficult at all Not difficult at all      All questions at time of visit were answered - patient instructed to contact office with any additional concerns or updates.  ER/RTC precautions were reviewed with the patient.  Please note: voice recognition software was used to produce this document, and typos may escape review. Please contact Dr. Sheppard Coil for any needed clarifications.

## 2019-12-17 DIAGNOSIS — R002 Palpitations: Secondary | ICD-10-CM | POA: Diagnosis not present

## 2019-12-21 ENCOUNTER — Other Ambulatory Visit: Payer: Self-pay | Admitting: Osteopathic Medicine

## 2019-12-21 DIAGNOSIS — N63 Unspecified lump in unspecified breast: Secondary | ICD-10-CM

## 2019-12-23 ENCOUNTER — Other Ambulatory Visit: Payer: Self-pay

## 2019-12-23 ENCOUNTER — Ambulatory Visit
Admission: RE | Admit: 2019-12-23 | Discharge: 2019-12-23 | Disposition: A | Discharge status: Home / Self Care | Payer: Federal, State, Local not specified - PPO | Source: Ambulatory Visit | Attending: Osteopathic Medicine | Admitting: Osteopathic Medicine

## 2019-12-23 DIAGNOSIS — R928 Other abnormal and inconclusive findings on diagnostic imaging of breast: Secondary | ICD-10-CM | POA: Diagnosis not present

## 2020-01-14 ENCOUNTER — Ambulatory Visit (AMBULATORY_SURGERY_CENTER): Payer: Self-pay

## 2020-01-14 ENCOUNTER — Other Ambulatory Visit: Payer: Self-pay

## 2020-01-14 VITALS — Ht 64.5 in | Wt 130.2 lb

## 2020-01-14 DIAGNOSIS — Z1211 Encounter for screening for malignant neoplasm of colon: Secondary | ICD-10-CM

## 2020-01-14 NOTE — Progress Notes (Signed)
No allergies to soy or egg Pt is not on blood thinners or diet pills Denies issues with sedation/intubation Denies atrial flutter/fib Denies constipation   Emmi instructions given to pt  Pt is aware of Covid safety and care partner requirements.  Patient declined Suprep due to taste.  After discussing option, pt requested HD miralax as prep.

## 2020-02-04 ENCOUNTER — Other Ambulatory Visit: Payer: Self-pay

## 2020-02-04 ENCOUNTER — Ambulatory Visit (AMBULATORY_SURGERY_CENTER): Payer: Federal, State, Local not specified - PPO | Admitting: Gastroenterology

## 2020-02-04 ENCOUNTER — Encounter: Payer: Self-pay | Admitting: Gastroenterology

## 2020-02-04 VITALS — BP 106/66 | HR 59 | Temp 97.9°F | Resp 18 | Ht 64.0 in | Wt 130.0 lb

## 2020-02-04 DIAGNOSIS — D120 Benign neoplasm of cecum: Secondary | ICD-10-CM

## 2020-02-04 DIAGNOSIS — Z1211 Encounter for screening for malignant neoplasm of colon: Secondary | ICD-10-CM

## 2020-02-04 DIAGNOSIS — Z86010 Personal history of colonic polyps: Secondary | ICD-10-CM

## 2020-02-04 MED ORDER — SODIUM CHLORIDE 0.9 % IV SOLN: 500.0000 mL | Freq: Once | INTRAVENOUS | Status: DC

## 2020-02-04 NOTE — Progress Notes (Signed)
Vital signs checked by:CW  The patient states no changes in medical or surgical history since pre-visit screening on 01/14/2020.

## 2020-02-04 NOTE — Op Note (Addendum)
[place] Patient Name: Carolyn Berry Procedure Date: 02/04/2020 8:55 AM MRN: [id number] Endoscopist: Thornton Park MD, MD Age: 64 Referring MD:  Date of Birth: 1955-12-07 Gender: Female Account #: [id number] Procedure:                Colonoscopy Indications:              Surveillance: Personal history of adenomatous                            polyps on last colonoscopy > 5 years ago                           History of colon polyps                           Last colonosopy was normal - 2011 in [place], [place]                           No known family history of colon cancer or polyps Medicines:                Monitored Anesthesia Care Procedure:                Pre-Anesthesia Assessment:                           - Prior to the procedure, a History and Physical                            was performed, and patient medications and                            allergies were reviewed. The patient's tolerance of                            previous anesthesia was also reviewed. The risks                            and benefits of the procedure and the sedation                            options and risks were discussed with the patient.                            All questions were answered, and informed consent                            was obtained. Prior Anticoagulants: The patient has                            taken no previous anticoagulant or antiplatelet                            agents. ASA Grade Assessment: II - A patient with  mild systemic disease. After reviewing the risks                            and benefits, the patient was deemed in                            satisfactory condition to undergo the procedure.                           After obtaining informed consent, the colonoscope                            was passed under direct vision. Throughout the                            procedure, the patient's blood pressure, pulse,  and                            oxygen saturations were monitored continuously. The                            Colonoscope was introduced through the anus and                            advanced to the 3 cm into the ileum. A second                            forward view of the right colon was performed. The                            colonoscopy was performed without difficulty. The                            patient tolerated the procedure well. The quality                            of the bowel preparation was good. However, there                            was a large volume of green liquid that needed to                            be aspirated for a complete exam. Over 1060mL were                            removed during the procedure. The terminal ileum,                            ileocecal valve, appendiceal orifice, and rectum                            were photographed. Scope In: 9:02:01 AM Scope Out: 9:19:33 AM Scope Withdrawal Time: 0 hours 14  minutes 40 seconds  Total Procedure Duration: 0 hours 17 minutes 32 seconds  Findings:                 The perianal and digital rectal examinations were                            normal.                           A 1-2 mm polyp was found in the cecum. The polyp                            was sessile. The polyp was removed with a cold                            snare. Resection and retrieval were complete.                            Estimated blood loss was minimal.                           The exam was otherwise without abnormality on                            direct and retroflexion views. Complications:            No immediate complications. Estimated blood loss:                            Minimal. Estimated Blood Loss:     Estimated blood loss was minimal. Impression:               - One -12 mm polyp in the cecum, removed with a                            cold snare. Resected and retrieved.                           - The  examination was otherwise normal on direct                            and retroflexion views. Recommendation:           - Patient has a contact number available for                            emergencies. The signs and symptoms of potential                            delayed complications were discussed with the                            patient. Return to normal activities tomorrow.                            Written discharge instructions were provided  to the                            patient.                           - Resume previous diet.                           - Continue present medications.                           - Await pathology results.                           - Repeat colonoscopy date to be determined after                            pending pathology results are reviewed for                            surveillance. Will use a different bowel prep at                            that time including consideration for a two day                            prep.                           - Emerging evidence supports eating a diet of                            fruits, vegetables, grains, calcium, and yogurt                            while reducing red meat and alcohol may reduce the                            risk of colon cancer.                           - Thank you for allowing me to be involved in your                            colon cancer prevention. Thornton Park MD, MD 02/04/2020 9:24:50 AM This report has been signed electronically.

## 2020-02-04 NOTE — Progress Notes (Signed)
Called to room to assist during endoscopic procedure.  Patient ID and intended procedure confirmed with present staff. Received instructions for my participation in the procedure from the performing physician.  

## 2020-02-04 NOTE — Progress Notes (Signed)
A and O x3. Report to RN. Tolerated MAC anesthesia well.

## 2020-02-04 NOTE — Patient Instructions (Signed)
YOU HAD AN ENDOSCOPIC PROCEDURE TODAY AT THE [facility]:   Refer to the procedure report that was given to you for any specific questions about what was found during the examination.  If the procedure report does not answer your questions, please call your gastroenterologist to clarify.  If you requested that your care partner not be given the details of your procedure findings, then the procedure report has been included in a sealed envelope for you to review at your convenience later.  YOU SHOULD EXPECT: Some feelings of bloating in the abdomen. Passage of more gas than usual.  Walking can help get rid of the air that was put into your GI tract during the procedure and reduce the bloating. If you had a lower endoscopy (such as a colonoscopy or flexible sigmoidoscopy) you may notice spotting of blood in your stool or on the toilet paper. If you underwent a bowel prep for your procedure, you may not have a normal bowel movement for a few days.  Please Note:  You might notice some irritation and congestion in your nose or some drainage.  This is from the oxygen used during your procedure.  There is no need for concern and it should clear up in a day or so.  SYMPTOMS TO REPORT IMMEDIATELY:   Following lower endoscopy (colonoscopy or flexible sigmoidoscopy):  Excessive amounts of blood in the stool  Significant tenderness or worsening of abdominal pains  Swelling of the abdomen that is new, acute  Fever of 100F or higher  For urgent or emergent issues, a gastroenterologist can be reached at any hour by calling [phone]. Do not use MyChart messaging for urgent concerns.    DIET:  We do recommend a small meal at first, but then you may proceed to your regular diet.  Drink plenty of fluids but you should avoid alcoholic beverages for 24 hours.  ACTIVITY:  You should plan to take it easy for the rest of today and you should NOT DRIVE or use heavy machinery until tomorrow (because  of the sedation medicines used during the test).    FOLLOW UP: Our staff will call the number listed on your records 48-72 hours following your procedure to check on you and address any questions or concerns that you may have regarding the information given to you following your procedure. If we do not reach you, we will leave a message.  We will attempt to reach you two times.  During this call, we will ask if you have developed any symptoms of COVID 19. If you develop any symptoms (ie: fever, flu-like symptoms, shortness of breath, cough etc.) before then, please call [phone].  If you test positive for Covid 19 in the 2 weeks post procedure, please call and report this information to us.    If any biopsies were taken you will be contacted by phone or by letter within the next 1-3 weeks.  Please call us at [phone] if you have not heard about the biopsies in 3 weeks.    SIGNATURES/CONFIDENTIALITY: You and/or your care partner have signed paperwork which will be entered into your electronic medical record.  These signatures attest to the fact that that the information above on your After Visit Summary has been reviewed and is understood.  Full responsibility of the confidentiality of this discharge information lies with you and/or your care-partner. 

## 2020-02-07 DIAGNOSIS — D120 Benign neoplasm of cecum: Secondary | ICD-10-CM | POA: Diagnosis not present

## 2020-02-08 ENCOUNTER — Telehealth: Payer: Self-pay | Admitting: *Deleted

## 2020-02-08 ENCOUNTER — Telehealth: Payer: Self-pay

## 2020-02-08 NOTE — Telephone Encounter (Signed)
  Follow up Call-  Call back number 02/04/2020  Post procedure Call Back phone  # [phone]  Permission to leave phone message Yes  Some recent data might be hidden     Patient questions:  Do you have a fever, pain , or abdominal swelling? No. Pain Score  0 *  Have you tolerated food without any problems? Yes.    Have you been able to return to your normal activities? Yes.    Do you have any questions about your discharge instructions: Diet   No. Medications  No. Follow up visit  No.  Do you have questions or concerns about your Care? No.  Actions: * If pain score is 4 or above: No action needed, pain <4.   1. Have you developed a fever since your procedure? no  2.   Have you had an respiratory symptoms (SOB or cough) since your procedure? no  3.   Have you tested positive for COVID 19 since your procedure no  4.   Have you had any family members/close contacts diagnosed with the COVID 19 since your procedure?  no   If yes to any of these questions please route to Joylene John, RN and Joella Prince, RN

## 2020-02-08 NOTE — Telephone Encounter (Signed)
Called #[phone] and left a messaged we tried to reach pt for a follow up call. maw

## 2020-02-11 ENCOUNTER — Encounter: Payer: Self-pay | Admitting: Gastroenterology

## 2020-02-12 ENCOUNTER — Telehealth: Payer: Self-pay

## 2020-02-12 NOTE — Telephone Encounter (Signed)
Patient called into office stating she had concerns about her systolic blood pressure reading. The patient stated her cardiologist prescribed the medication. Phone call was returned to the patient, a message was left on patient voicemail for her to contact her cardiologist about dosage concerns to medication as well blood pressure readings.

## 2020-02-16 ENCOUNTER — Telehealth: Payer: Self-pay

## 2020-02-16 NOTE — Telephone Encounter (Signed)
Patient called into office Friday saying her blood pressure medication is not working. The patient has a cardiologist but says that you are managing it. Please advise. The patient was contacted on Friday, I left a voicemail advising patient that you were out of the office and to contact the cardiologist ( the provider who prescribed the medication.

## 2020-02-17 NOTE — Telephone Encounter (Signed)
I havent seen her since May  Needs virtual or in office visit to confirm BP and check home monitor - BRING HOME BP MONITOR TO OFFICE if she has one

## 2020-02-18 ENCOUNTER — Telehealth: Payer: Self-pay

## 2020-02-18 NOTE — Telephone Encounter (Signed)
Patient left message on voicemail for the office to call her. I attempted to contacted patient to schedule an appointment per Dr. Sheppard Coil. Left voicemail for patient to return call and schedule appointment.

## 2020-02-18 NOTE — Telephone Encounter (Signed)
Patient contacted, left message on patient's voicemail advising patient that she will need an appointment to discuss Blood pressure medication with PCP.

## 2020-02-18 NOTE — Telephone Encounter (Signed)
Please see Dr Luretha Rued note and call patient to schedule BP follow up in office with PCP with home maching

## 2020-03-22 ENCOUNTER — Ambulatory Visit: Payer: Federal, State, Local not specified - PPO | Admitting: Osteopathic Medicine

## 2020-03-22 ENCOUNTER — Encounter: Payer: Self-pay | Admitting: Osteopathic Medicine

## 2020-03-22 VITALS — BP 163/97 | HR 75 | Wt 131.0 lb

## 2020-03-22 DIAGNOSIS — Z23 Encounter for immunization: Secondary | ICD-10-CM

## 2020-03-22 DIAGNOSIS — I10 Essential (primary) hypertension: Secondary | ICD-10-CM | POA: Diagnosis not present

## 2020-03-22 DIAGNOSIS — R252 Cramp and spasm: Secondary | ICD-10-CM

## 2020-03-22 MED ORDER — TELMISARTAN 80 MG PO TABS: 80.0000 mg | ORAL_TABLET | Freq: Every day | ORAL | 3 refills | Status: DC

## 2020-03-22 MED ORDER — LISINOPRIL-HYDROCHLOROTHIAZIDE 10-12.5 MG PO TABS: 1.0000 | ORAL_TABLET | Freq: Every day | ORAL | 0 refills | Status: DC

## 2020-03-22 NOTE — Progress Notes (Signed)
Carolyn Berry is a 64 y.o. female who presents to  [place] at [facility]  today, 03/22/20, seeking care for the following:  . Concern for BP Rx - BP high on or off current Rx, inquires about changing, pharmacist suggested lisinopril. No CP/SOB, no HA/VC.  Marland Kitchen Occasional nighttime leg cramps . Flu vax  . Questions about health maintenance Hep C      ASSESSMENT & PLAN with other pertinent findings:  The primary encounter diagnosis was Essential hypertension. Diagnoses of Need for diphtheria-tetanus-pertussis (Tdap) vaccine, Flu vaccine need, and Muscle cramp, nocturnal were also pertinent to this visit.   1. Need for diphtheria-tetanus-pertussis (Tdap) vaccine done  2. Flu vaccine need done  3. Essential hypertension --> see below lisinopril-HCT -->MyChart message in 1 week: what are BP numbers --> MyChart message in 6 week: labs  Meds ordered this encounter  Medications  . DISCONTD: telmisartan (MICARDIS) 80 MG tablet    Sig: Take 1 tablet (80 mg total) by mouth daily.    Dispense:  90 tablet    Refill:  3  . lisinopril-hydrochlorothiazide (ZESTORETIC) 10-12.5 MG tablet    Sig: Take 1 tablet by mouth daily. If BP still higher than 725 systolic and/or 90 diastolic, please increase to 2 tablets daily (20-25 mg)    Dispense:  90 tablet    Refill:  0    CANCEL TELMESARTAN    4. Muscle cramp, nocturnal declines medication at this time, discussed compression stockings, possible RLS variant can do Rx if needed, pt will think about this.    No results found for this or any previous visit (from the past 24 hour(s)).   BP Readings from Last 3 Encounters:  03/22/20 (!) 163/97  02/04/20 106/66  11/24/19 (!) 159/91     There are no Patient Instructions on file for this visit.  Orders Placed This Encounter  Procedures  . Tdap vaccine greater than or equal to 7yo IM  . Flu Vaccine QUAD High Dose(Fluad)    Meds  ordered this encounter  Medications  . DISCONTD: telmisartan (MICARDIS) 80 MG tablet    Sig: Take 1 tablet (80 mg total) by mouth daily.    Dispense:  90 tablet    Refill:  3  . lisinopril-hydrochlorothiazide (ZESTORETIC) 10-12.5 MG tablet    Sig: Take 1 tablet by mouth daily. If BP still higher than 366 systolic and/or 90 diastolic, please increase to 2 tablets daily (20-25 mg)    Dispense:  90 tablet    Refill:  0    CANCEL TELMESARTAN       Follow-up instructions: Return for MYCHART MESSAGE SET TO CHECK ON BP IN 1 WEEK, LABS IN 6 WEEKS .                                         BP (!) 163/97 (BP Location: Left Arm, Patient Position: Sitting)   Pulse 75   Wt 131 lb (59.4 kg)   SpO2 100%   BMI 22.49 kg/m   Current Meds  Medication Sig  . AMBULATORY NON FORMULARY MEDICATION BP machine - cuff around arm. Dx I10  . Calcium Carbonate-Vitamin D (CALCIUM + D PO) Take by mouth.  . Multiple Vitamins-Minerals (MULTIVITAMIN PO) Take by mouth.    No results found for this or any previous visit (from the past 72 hour(s)).  No  results found.     All questions at time of visit were answered - patient instructed to contact office with any additional concerns or updates.  ER/RTC precautions were reviewed with the patient as applicable.   Please note: voice recognition software was used to produce this document, and typos may escape review. Please contact Dr. Sheppard Coil for any needed clarifications.

## 2020-03-29 ENCOUNTER — Ambulatory Visit: Payer: Federal, State, Local not specified - PPO | Admitting: Cardiology

## 2020-06-14 ENCOUNTER — Other Ambulatory Visit: Payer: Self-pay | Admitting: Osteopathic Medicine

## 2020-07-05 DIAGNOSIS — D225 Melanocytic nevi of trunk: Secondary | ICD-10-CM | POA: Diagnosis not present

## 2020-07-05 DIAGNOSIS — D1801 Hemangioma of skin and subcutaneous tissue: Secondary | ICD-10-CM | POA: Diagnosis not present

## 2020-07-05 DIAGNOSIS — L821 Other seborrheic keratosis: Secondary | ICD-10-CM | POA: Diagnosis not present

## 2020-07-05 DIAGNOSIS — L814 Other melanin hyperpigmentation: Secondary | ICD-10-CM | POA: Diagnosis not present

## 2020-09-06 ENCOUNTER — Other Ambulatory Visit: Payer: Self-pay | Admitting: Osteopathic Medicine

## 2020-09-27 ENCOUNTER — Emergency Department
Admission: EM | Admit: 2020-09-27 | Discharge: 2020-09-27 | Disposition: A | Discharge status: Home / Self Care | Payer: Federal, State, Local not specified - PPO | Source: Home / Self Care | Attending: Family Medicine | Admitting: Family Medicine

## 2020-09-27 ENCOUNTER — Telehealth: Payer: Self-pay

## 2020-09-27 DIAGNOSIS — S20362A Insect bite (nonvenomous) of left front wall of thorax, initial encounter: Secondary | ICD-10-CM

## 2020-09-27 DIAGNOSIS — W57XXXA Bitten or stung by nonvenomous insect and other nonvenomous arthropods, initial encounter: Secondary | ICD-10-CM | POA: Diagnosis not present

## 2020-09-27 DIAGNOSIS — I10 Essential (primary) hypertension: Secondary | ICD-10-CM | POA: Insufficient documentation

## 2020-09-27 MED ORDER — DOXYCYCLINE HYCLATE 100 MG PO CAPS: ORAL_CAPSULE | ORAL | 0 refills | Status: DC

## 2020-09-27 NOTE — ED Triage Notes (Signed)
Pt presents to Urgent Care with tick bite. She removed tick from her L upper chest today; states it had been there for at least a few days because she thought it was a mole. Small raised, reddened area noted where tick was attached.

## 2020-09-27 NOTE — ED Provider Notes (Signed)
[facility]    CSN: [id number] Arrival date & time: 09/27/20  1541      History   Chief Complaint Chief Complaint  Patient presents with  . Tick bite    HPI Carolyn Berry is a 65 y.o. female.   HPI  Patient states that she took a walk on Easter Sunday, day before yesterday.  Does not recall being outdoors at any other time.  She is here today with a tick bite on her anterior chest.  She found the tick this morning and removed it.  She is here for evaluation.  She feels well.  She is concerned about tickborne illness.  She brings in the tick for analysis.  In a Tupperware container there is a small nymph, recently hatched deer tick  Past Medical History:  Diagnosis Date  . Elevated blood-pressure reading without diagnosis of hypertension   . Hypertension   . Osteoporosis    osteopenis    Patient Active Problem List   Diagnosis Date Noted  . Essential hypertension 09/27/2020  . 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 Family history of melanoma 03/19/2014  . TMJ arthralgia 10/01/2013  . Osteopenia 09/29/2013  . Personal history of colonic polyps 09/29/2013  . Prediabetes 09/29/2013  . Hyperlipidemia 09/29/2013    Past Surgical History:  Procedure Laterality Date  . COLONOSCOPY  2011  . POLYPECTOMY    . skin tumor removal ?lipoma - benign    . TUBAL LIGATION  1985    OB History   No obstetric history on file.      Home Medications    Prior to Admission medications   Medication Sig Start Date End Date Taking? Authorizing Provider  doxycycline (VIBRAMYCIN) 100 MG capsule Take 2 tabs as single dose at time of tick removal 09/27/20  Yes Raylene Everts, MD  AMBULATORY NON FORMULARY MEDICATION BP machine - cuff around arm. Dx I10 11/02/19   Emeterio Reeve, DO  Calcium Carbonate-Vitamin D (CALCIUM + D PO) Take by mouth.    [provider]  lisinopril-hydrochlorothiazide (ZESTORETIC) 10-12.5 MG tablet TAKE 1 TABLET BY MOUTH DAILY, IF BLOOD  PRESSURE STILL HIGHER THAN 469 SYSTOLIC AND/OR 90 DIASTOLIC INCREASE TO 2 TABLETS DAILY 09/06/20   Emeterio Reeve, DO  Multiple Vitamins-Minerals (MULTIVITAMIN PO) Take by mouth.    [provider]    Family History Family History  Problem Relation Age of Onset  . Alcoholism Other        brother  . Cancer Mother   . Hypertension Mother   . Melanoma Mother   . Hypertension Father   . Colon cancer Neg Hx   . Colon polyps Neg Hx   . Esophageal cancer Neg Hx   . Rectal cancer Neg Hx   . Stomach cancer Neg Hx     Social History Social History   Tobacco Use  . Smoking status: Never Smoker  . Smokeless tobacco: Never Used  Vaping Use  . Vaping Use: Never used  Substance Use Topics  . Alcohol use: Yes    Comment: rare use, social   . Drug use: No     Allergies   Patient has no known allergies.   Review of Systems Review of Systems See HPI  Physical Exam Triage Vital Signs ED Triage Vitals  Enc Vitals Group     BP 09/27/20 1609 122/88     Pulse Rate 09/27/20 1609 89     Resp 09/27/20 1609 18     Temp 09/27/20  1609 98.9 F (37.2 C)     Temp Source 09/27/20 1609 Oral     SpO2 09/27/20 1609 98 %     Weight 09/27/20 1607 130 lb (59 kg)     Height 09/27/20 1607 5\' 5"  (1.651 m)     Head Circumference --      Peak Flow --      Pain Score 09/27/20 1607 0     Pain Loc --      Pain Edu? --      Excl. in [place]? --    No data found.  Updated Vital Signs BP 122/88 (BP Location: Right Arm)   Pulse 89   Temp 98.9 F (37.2 C) (Oral)   Resp 18   Ht 5\' 5"  (1.651 m)   Wt 59 kg   SpO2 98%   BMI 21.63 kg/m     Physical Exam Constitutional:      General: She is not in acute distress.    Appearance: Normal appearance. She is well-developed.  HENT:     Head: Normocephalic and atraumatic.     Nose:     Comments: Wearing mask Eyes:     Conjunctiva/sclera: Conjunctivae normal.     Pupils: Pupils are equal, round, and reactive to light.  Cardiovascular:      Rate and Rhythm: Normal rate.  Pulmonary:     Effort: Pulmonary effort is normal. No respiratory distress.  Abdominal:     General: There is no distension.     Palpations: Abdomen is soft.  Musculoskeletal:        General: Normal range of motion.     Cervical back: Normal range of motion.  Skin:    General: Skin is warm and dry.     Comments: 5 mm palpable papule, erythematous, present on anterior chest above left breast.  No tick parts remain  Neurological:     Mental Status: She is alert.  Psychiatric:        Behavior: Behavior normal.      UC Treatments / Results  Labs (all labs ordered are listed, but only abnormal results are displayed) Labs Reviewed - No data to display  EKG   Radiology No results found.  Procedures Procedures (including critical care time)  Medications Ordered in UC Medications - No data to display  Initial Impression / Assessment and Plan / UC Course  I have reviewed the triage vital signs and the nursing notes.  Pertinent labs & imaging results that were available during my care of the patient were reviewed by me and considered in my medical decision making (see chart for details).     Reviewed that this tick is not likely to have Lyme bacteria.  Because of her concerns I will give her a single dose of doxycycline for protection.  Prevention discussed Final Clinical Impressions(s) / UC Diagnoses   Final diagnoses:  Tick bite of left front wall of thorax, initial encounter     Discharge Instructions     Return as needed   ED Prescriptions    Medication Sig Dispense Auth. Provider   doxycycline (VIBRAMYCIN) 100 MG capsule Take 2 tabs as single dose at time of tick removal 10 capsule Meda Coffee Jennette Banker, MD     PDMP not reviewed this encounter.   Raylene Everts, MD 09/27/20 [phone]

## 2020-09-27 NOTE — Telephone Encounter (Signed)
Patient's husband called stating that patient pulled off a tick from her body that was embedded. Please contact patient to schedule an appointment for an evaluation. Thanks in advance.

## 2020-09-27 NOTE — Discharge Instructions (Addendum)
Return as needed

## 2020-09-27 NOTE — Telephone Encounter (Signed)
Patient has made an appt for tomorrow.

## 2020-09-28 ENCOUNTER — Ambulatory Visit: Payer: Federal, State, Local not specified - PPO | Admitting: Family Medicine

## 2020-09-29 ENCOUNTER — Other Ambulatory Visit: Payer: Self-pay | Admitting: Osteopathic Medicine

## 2020-09-29 DIAGNOSIS — Z1231 Encounter for screening mammogram for malignant neoplasm of breast: Secondary | ICD-10-CM

## 2020-09-30 ENCOUNTER — Telehealth: Payer: Self-pay

## 2020-09-30 DIAGNOSIS — R7303 Prediabetes: Secondary | ICD-10-CM

## 2020-09-30 DIAGNOSIS — M8580 Other specified disorders of bone density and structure, unspecified site: Secondary | ICD-10-CM

## 2020-09-30 DIAGNOSIS — Z13820 Encounter for screening for osteoporosis: Secondary | ICD-10-CM

## 2020-09-30 DIAGNOSIS — E785 Hyperlipidemia, unspecified: Secondary | ICD-10-CM

## 2020-09-30 DIAGNOSIS — Z0000 Encounter for general adult medical examination without abnormal findings: Secondary | ICD-10-CM

## 2020-09-30 DIAGNOSIS — R030 Elevated blood-pressure reading, without diagnosis of hypertension: Secondary | ICD-10-CM

## 2020-09-30 NOTE — Telephone Encounter (Signed)
Pt called requesting annual labs and bone density order. Annual labs and referral for bone density pended. Does covering provider want to add add'l orders/dxs?

## 2020-09-30 NOTE — Telephone Encounter (Signed)
Pended orders signed.  No additional orders.

## 2020-10-13 ENCOUNTER — Encounter (HOSPITAL_BASED_OUTPATIENT_CLINIC_OR_DEPARTMENT_OTHER): Payer: Self-pay

## 2020-10-13 ENCOUNTER — Ambulatory Visit (HOSPITAL_BASED_OUTPATIENT_CLINIC_OR_DEPARTMENT_OTHER)
Admission: RE | Admit: 2020-10-13 | Discharge: 2020-10-13 | Disposition: A | Discharge status: Home / Self Care | Payer: Federal, State, Local not specified - PPO | Source: Ambulatory Visit | Attending: Osteopathic Medicine | Admitting: Osteopathic Medicine

## 2020-10-13 ENCOUNTER — Other Ambulatory Visit: Payer: Self-pay

## 2020-10-13 ENCOUNTER — Ambulatory Visit: Payer: Federal, State, Local not specified - PPO

## 2020-10-13 DIAGNOSIS — E785 Hyperlipidemia, unspecified: Secondary | ICD-10-CM | POA: Diagnosis not present

## 2020-10-13 DIAGNOSIS — Z0000 Encounter for general adult medical examination without abnormal findings: Secondary | ICD-10-CM | POA: Diagnosis not present

## 2020-10-13 DIAGNOSIS — R7303 Prediabetes: Secondary | ICD-10-CM | POA: Diagnosis not present

## 2020-10-13 DIAGNOSIS — Z1231 Encounter for screening mammogram for malignant neoplasm of breast: Secondary | ICD-10-CM | POA: Diagnosis not present

## 2020-10-13 DIAGNOSIS — R030 Elevated blood-pressure reading, without diagnosis of hypertension: Secondary | ICD-10-CM | POA: Diagnosis not present

## 2020-10-14 LAB — CBC WITH DIFFERENTIAL/PLATELET
Absolute Monocytes: 279 cells/uL (ref 200–950)
Basophils Absolute: 29 cells/uL (ref 0–200)
Basophils Relative: 0.5 %
Eosinophils Absolute: 40 cells/uL (ref 15–500)
Eosinophils Relative: 0.7 %
HCT: 45.2 % — ABNORMAL HIGH (ref 35.0–45.0)
Hemoglobin: 14.1 g/dL (ref 11.7–15.5)
Lymphs Abs: 1522 cells/uL (ref 850–3900)
MCH: 26.6 pg — ABNORMAL LOW (ref 27.0–33.0)
MCHC: 31.2 g/dL — ABNORMAL LOW (ref 32.0–36.0)
MCV: 85.1 fL (ref 80.0–100.0)
MPV: 10 fL (ref 7.5–12.5)
Monocytes Relative: 4.9 %
Neutro Abs: 3830 cells/uL (ref 1500–7800)
Neutrophils Relative %: 67.2 %
Platelets: 290 10*3/uL (ref 140–400)
RBC: 5.31 10*6/uL — ABNORMAL HIGH (ref 3.80–5.10)
RDW: 11.8 % (ref 11.0–15.0)
Total Lymphocyte: 26.7 %
WBC: 5.7 10*3/uL (ref 3.8–10.8)

## 2020-10-14 LAB — COMPLETE METABOLIC PANEL WITH GFR
AG Ratio: 2.4 (calc) (ref 1.0–2.5)
ALT: 9 U/L (ref 6–29)
AST: 13 U/L (ref 10–35)
Albumin: 4.6 g/dL (ref 3.6–5.1)
Alkaline phosphatase (APISO): 52 U/L (ref 37–153)
BUN: 15 mg/dL (ref 7–25)
CO2: 32 mmol/L (ref 20–32)
Calcium: 10 mg/dL (ref 8.6–10.4)
Chloride: 101 mmol/L (ref 98–110)
Creat: 0.79 mg/dL (ref 0.50–0.99)
GFR, Est African American: 90 mL/min/{1.73_m2} (ref >=60)
GFR, Est Non African American: 78 mL/min/{1.73_m2} (ref >=60)
Globulin: 1.9 g/dL (calc) (ref 1.9–3.7)
Glucose, Bld: 85 mg/dL (ref 65–99)
Potassium: 4.3 mmol/L (ref 3.5–5.3)
Sodium: 142 mmol/L (ref 135–146)
Total Bilirubin: 0.5 mg/dL (ref 0.2–1.2)
Total Protein: 6.5 g/dL (ref 6.1–8.1)

## 2020-10-14 LAB — LIPID PANEL W/REFLEX DIRECT LDL
Cholesterol: 221 mg/dL — ABNORMAL HIGH (ref <200)
HDL: 65 mg/dL (ref >=50)
LDL Cholesterol (Calc): 140 mg/dL (calc) — ABNORMAL HIGH
Non-HDL Cholesterol (Calc): 156 mg/dL (calc) — ABNORMAL HIGH (ref <130)
Total CHOL/HDL Ratio: 3.4 (calc) (ref <5.0)
Triglycerides: 66 mg/dL (ref <150)

## 2020-10-14 LAB — HEMOGLOBIN A1C
Hgb A1c MFr Bld: 5.7 % of total Hgb — ABNORMAL HIGH (ref <5.7)
Mean Plasma Glucose: 117 mg/dL
eAG (mmol/L): 6.5 mmol/L

## 2020-12-14 ENCOUNTER — Other Ambulatory Visit: Payer: Self-pay | Admitting: Osteopathic Medicine

## 2021-01-02 ENCOUNTER — Other Ambulatory Visit: Payer: Self-pay

## 2021-01-02 ENCOUNTER — Encounter: Payer: Self-pay | Admitting: Osteopathic Medicine

## 2021-01-02 ENCOUNTER — Ambulatory Visit: Payer: Federal, State, Local not specified - PPO | Admitting: Osteopathic Medicine

## 2021-01-02 VITALS — BP 121/81 | HR 82 | Temp 97.9°F | Wt 129.0 lb

## 2021-01-02 DIAGNOSIS — Z0000 Encounter for general adult medical examination without abnormal findings: Secondary | ICD-10-CM

## 2021-01-02 DIAGNOSIS — I10 Essential (primary) hypertension: Secondary | ICD-10-CM | POA: Diagnosis not present

## 2021-01-02 MED ORDER — LISINOPRIL-HYDROCHLOROTHIAZIDE 10-12.5 MG PO TABS: 1.0000 | ORAL_TABLET | Freq: Every day | ORAL | 3 refills | Status: DC

## 2021-01-02 NOTE — Progress Notes (Signed)
Carolyn Berry is a 65 y.o. female who presents to  [place] at [facility]  today, 01/02/21, seeking care for the following:  Annual physical -overall patient doing well, reports good blood pressure control, may be even a little bit low on days when she is out working in her garden and then will stand up too quickly and feel a bit dizzy, this is better if she stays well-hydrated.  Otherwise, no complaints or concerns!     ASSESSMENT & PLAN with other pertinent findings:  The primary encounter diagnosis was Annual physical exam. A diagnosis of Essential hypertension was also pertinent to this visit.    Patient Instructions  General Preventive Care Most recent routine screening labs: ordered.  Blood pressure goal 130/80 or less.  Tobacco: don't! Please let me know if you need help quitting! Alcohol: responsible moderation is ok for most adults - if you have concerns about your alcohol intake, please talk to me!  Exercise: as tolerated to reduce risk of cardiovascular disease and diabetes. Strength training will also prevent osteoporosis.  Mental health: if need for mental health care (medicines, counseling, other), or concerns about moods, please let me know!  Sexual / Reproductive health: if need for STD testing, or if concerns with libido/pain problems, please let me know!  Advanced Directive: Living Will and/or Healthcare Power of Attorney recommended for all adults, regardless of age or health.  Vaccines Flu vaccine: for almost everyone, every fall.  Shingles vaccine: after age 72 - done.  Pneumonia vaccines: after age 68 - done. Tetanus booster: due 2031 COVID vaccine: THANKS for getting your vaccine! :)  Cancer screenings  Colon cancer screening: Colonoscopy due 01/2027 Breast cancer screening: mammogram due 10/2021 Cervical cancer screening: Pap can stop at age 74 or w/ hysterectomy.  Lung cancer screening: not needed for  non-smokers  Infection screenings  HIV: recommended screening at least once age 20-65, more often as needed. Gonorrhea/Chlamydia: screening as needed Hepatitis C: recommended once for everyone age 0000000 TB: certain at-risk populations, work requirements and/or travel history Other Bone Density Test: due to repeat 04/2021  Orders Placed This Encounter  Procedures   CBC   COMPLETE METABOLIC PANEL WITH GFR   Lipid panel    Meds ordered this encounter  Medications   lisinopril-hydrochlorothiazide (ZESTORETIC) 10-12.5 MG tablet    Sig: Take 1 tablet by mouth daily.    Dispense:  90 tablet    Refill:  3     See below for relevant physical exam findings  See below for recent lab and imaging results reviewed  Medications, allergies, PMH, PSH, SocH, FamH reviewed below    Follow-up instructions: Return in about 1 year (around 01/02/2022) for [place] (call week prior to visit for lab orders).                                        Exam:  BP 121/81 (BP Location: Left Arm, Patient Position: Sitting, Cuff Size: Normal)   Pulse 82   Temp 97.9 F (36.6 C) (Oral)   Wt 129 lb (58.5 kg)   BMI 21.47 kg/m  Constitutional: VS see above. General Appearance: alert, well-developed, well-nourished, NAD Neck: No masses, trachea midline.  Respiratory: Normal respiratory effort. no wheeze, no rhonchi, no rales Cardiovascular: S1/S2 normal, no murmur, no rub/gallop auscultated. RRR.  Musculoskeletal: Gait normal. Symmetric and independent movement of  all extremities Abdominal: non-tender, non-distended, no appreciable organomegaly, neg Murphy's, BS WNLx4 Neurological: Normal balance/coordination. No tremor. Skin: warm, dry, intact.  Psychiatric: Normal judgment/insight. Normal mood and affect. Oriented x3.   Current Meds  Medication Sig   AMBULATORY NON FORMULARY MEDICATION BP machine - cuff around arm. Dx I10   Calcium Carbonate-Vitamin D (CALCIUM + D PO)  Take by mouth.   Multiple Vitamins-Minerals (MULTIVITAMIN PO) Take by mouth.   [DISCONTINUED] doxycycline (VIBRAMYCIN) 100 MG capsule Take 2 tabs as single dose at time of tick removal   [DISCONTINUED] lisinopril-hydrochlorothiazide (ZESTORETIC) 10-12.5 MG tablet Take 1 tablet by mouth daily. Needs appointment.    No Known Allergies  Patient Active Problem List   Diagnosis Date Noted   Essential hypertension 09/27/2020   Iris nevus 10/19/2015   Family history of melanoma 03/19/2014   TMJ arthralgia 10/01/2013   Osteopenia 09/29/2013   Personal history of colonic polyps 09/29/2013   Prediabetes 09/29/2013   Hyperlipidemia 09/29/2013    Family History  Problem Relation Age of Onset   Alcoholism Other        brother   Cancer Mother    Hypertension Mother    Melanoma Mother    Hypertension Father    Colon cancer Neg Hx    Colon polyps Neg Hx    Esophageal cancer Neg Hx    Rectal cancer Neg Hx    Stomach cancer Neg Hx     Social History   Tobacco Use  Smoking Status Never  Smokeless Tobacco Never    Past Surgical History:  Procedure Laterality Date   COLONOSCOPY  2011   POLYPECTOMY     skin tumor removal ?lipoma - benign     TUBAL LIGATION  1985    Immunization History  Administered Date(s) Administered   Fluad Quad(high Dose 65+) 03/15/2019, 03/22/2020   Influenza Inj Mdck Quad With Preservative 03/24/2018   Influenza,inj,Quad PF,6+ Mos 03/19/2014   Influenza-Unspecified 03/24/2018, 03/15/2019   PFIZER(Purple Top)SARS-COV-2 Vaccination 07/08/2019, 07/29/2019, 04/06/2020, 10/29/2020   Pneumococcal Polysaccharide-23 04/30/2019   Tdap 07/05/2009, 03/22/2020   Zoster Recombinat (Shingrix) 06/17/2018, 08/18/2018   Zoster, Live 06/11/2014    Recent Results (from the past 2160 hour(s))  Lipid Panel w/reflex Direct LDL     Status: Abnormal   Collection Time: 10/13/20 12:00 AM  Result Value Ref Range   Cholesterol 221 (H) <200 mg/dL   HDL 65 > OR = 50 mg/dL    Triglycerides 66 <150 mg/dL   LDL Cholesterol (Calc) 140 (H) mg/dL (calc)    Comment: Reference range: <100 . Desirable range <100 mg/dL for primary prevention;   <70 mg/dL for patients with CHD or diabetic patients  with > or = 2 CHD risk factors. Marland Kitchen LDL-C is now calculated using the Martin-Hopkins  calculation, which is a validated novel method providing  better accuracy than the Friedewald equation in the  estimation of LDL-C.  Cresenciano Genre et al. Annamaria Helling. [id number]): 2061-2068  (http://education.[facility].com/faq/FAQ164)    Total CHOL/HDL Ratio 3.4 <5.0 (calc)   Non-HDL Cholesterol (Calc) 156 (H) <130 mg/dL (calc)    Comment: For patients with diabetes plus 1 major ASCVD risk  factor, treating to a non-HDL-C goal of <100 mg/dL  (LDL-C of <70 mg/dL) is considered a therapeutic  option.   HgB A1c     Status: Abnormal   Collection Time: 10/13/20 12:00 AM  Result Value Ref Range   Hgb A1c MFr Bld 5.7 (H) <5.7 % of total Hgb    Comment: For  someone without known diabetes, a hemoglobin  A1c value between 5.7% and 6.4% is consistent with prediabetes and should be confirmed with a  follow-up test. . For someone with known diabetes, a value <7% indicates that their diabetes is well controlled. A1c targets should be individualized based on duration of diabetes, age, comorbid conditions, and other considerations. . This assay result is consistent with an increased risk of diabetes. . Currently, no consensus exists regarding use of hemoglobin A1c for diagnosis of diabetes for children. .    Mean Plasma Glucose 117 mg/dL   eAG (mmol/L) 6.5 mmol/L  COMPLETE METABOLIC PANEL WITH GFR     Status: None   Collection Time: 10/13/20 12:00 AM  Result Value Ref Range   Glucose, Bld 85 65 - 99 mg/dL    Comment: .            Fasting reference interval .    BUN 15 7 - 25 mg/dL   Creat 0.79 0.50 - 0.99 mg/dL    Comment: For patients >21 years of age, the reference limit for  Creatinine is approximately 13% higher for people identified as African-American. .    GFR, Est Non African American 78 > OR = 60 mL/min/1.73m   GFR, Est African American 90 > OR = 60 mL/min/1.790m  BUN/Creatinine Ratio NOT APPLICABLE 6 - 22 (calc)   Sodium 142 135 - 146 mmol/L   Potassium 4.3 3.5 - 5.3 mmol/L   Chloride 101 98 - 110 mmol/L   CO2 32 20 - 32 mmol/L   Calcium 10.0 8.6 - 10.4 mg/dL   Total Protein 6.5 6.1 - 8.1 g/dL   Albumin 4.6 3.6 - 5.1 g/dL   Globulin 1.9 1.9 - 3.7 g/dL (calc)   AG Ratio 2.4 1.0 - 2.5 (calc)   Total Bilirubin 0.5 0.2 - 1.2 mg/dL   Alkaline phosphatase (APISO) 52 37 - 153 U/L   AST 13 10 - 35 U/L   ALT 9 6 - 29 U/L  CBC w/Diff/Platelet     Status: Abnormal   Collection Time: 10/13/20 12:00 AM  Result Value Ref Range   WBC 5.7 3.8 - 10.8 Thousand/uL   RBC 5.31 (H) 3.80 - 5.10 Million/uL   Hemoglobin 14.1 11.7 - 15.5 g/dL   HCT 45.2 (H) 35.0 - 45.0 %   MCV 85.1 80.0 - 100.0 fL   MCH 26.6 (L) 27.0 - 33.0 pg   MCHC 31.2 (L) 32.0 - 36.0 g/dL   RDW 11.8 11.0 - 15.0 %   Platelets 290 140 - 400 Thousand/uL   MPV 10.0 7.5 - 12.5 fL   Neutro Abs 3,830 1,500 - 7,800 cells/uL   Lymphs Abs 1,522 850 - 3,900 cells/uL   Absolute Monocytes 279 200 - 950 cells/uL   Eosinophils Absolute 40 15 - 500 cells/uL   Basophils Absolute 29 0 - 200 cells/uL   Neutrophils Relative % 67.2 %   Total Lymphocyte 26.7 %   Monocytes Relative 4.9 %   Eosinophils Relative 0.7 %   Basophils Relative 0.5 %    No results found.     All questions at time of visit were answered - patient instructed to contact office with any additional concerns or updates. ER/RTC precautions were reviewed with the patient as applicable.   Please note: manual typing as well as voice recognition software may have been used to produce this document - typos may escape review. Please contact Dr. AlSheppard Coilor any needed clarifications.

## 2021-01-02 NOTE — Patient Instructions (Signed)
General Preventive Care Most recent routine screening labs: ordered.  Blood pressure goal 130/80 or less.  Tobacco: don't! Please let me know if you need help quitting! Alcohol: responsible moderation is ok for most adults - if you have concerns about your alcohol intake, please talk to me!  Exercise: as tolerated to reduce risk of cardiovascular disease and diabetes. Strength training will also prevent osteoporosis.  Mental health: if need for mental health care (medicines, counseling, other), or concerns about moods, please let me know!  Sexual / Reproductive health: if need for STD testing, or if concerns with libido/pain problems, please let me know!  Advanced Directive: Living Will and/or Healthcare Power of Attorney recommended for all adults, regardless of age or health.  Vaccines Flu vaccine: for almost everyone, every fall.  Shingles vaccine: after age 83 - done.  Pneumonia vaccines: after age 4 - done. Tetanus booster: due 2031 COVID vaccine: THANKS for getting your vaccine! :)  Cancer screenings  Colon cancer screening: Colonoscopy due 01/2027 Breast cancer screening: mammogram due 10/2021 Cervical cancer screening: Pap can stop at age 12 or w/ hysterectomy.  Lung cancer screening: not needed for non-smokers  Infection screenings  HIV: recommended screening at least once age 55-65, more often as needed. Gonorrhea/Chlamydia: screening as needed Hepatitis C: recommended once for everyone age 0000000 TB: certain at-risk populations, work requirements and/or travel history Other Bone Density Test: due to repeat 04/2021

## 2021-03-14 DIAGNOSIS — M9904 Segmental and somatic dysfunction of sacral region: Secondary | ICD-10-CM | POA: Diagnosis not present

## 2021-03-14 DIAGNOSIS — M9902 Segmental and somatic dysfunction of thoracic region: Secondary | ICD-10-CM | POA: Diagnosis not present

## 2021-03-14 DIAGNOSIS — M9903 Segmental and somatic dysfunction of lumbar region: Secondary | ICD-10-CM | POA: Diagnosis not present

## 2021-03-14 DIAGNOSIS — M5386 Other specified dorsopathies, lumbar region: Secondary | ICD-10-CM | POA: Diagnosis not present

## 2021-03-15 DIAGNOSIS — M9903 Segmental and somatic dysfunction of lumbar region: Secondary | ICD-10-CM | POA: Diagnosis not present

## 2021-03-15 DIAGNOSIS — M9904 Segmental and somatic dysfunction of sacral region: Secondary | ICD-10-CM | POA: Diagnosis not present

## 2021-03-15 DIAGNOSIS — M5386 Other specified dorsopathies, lumbar region: Secondary | ICD-10-CM | POA: Diagnosis not present

## 2021-03-15 DIAGNOSIS — M9902 Segmental and somatic dysfunction of thoracic region: Secondary | ICD-10-CM | POA: Diagnosis not present

## 2021-07-19 ENCOUNTER — Telehealth: Payer: Self-pay | Admitting: Osteopathic Medicine

## 2021-07-19 DIAGNOSIS — E785 Hyperlipidemia, unspecified: Secondary | ICD-10-CM

## 2021-07-19 DIAGNOSIS — I10 Essential (primary) hypertension: Secondary | ICD-10-CM

## 2021-07-19 NOTE — Telephone Encounter (Signed)
Patient is scheduled for a Transfer of care with Dr. Zigmund Daniel on 3/30. Pt asked is she can come for fasting lab work next week because she will be on vacation. Please advise

## 2021-07-25 NOTE — Telephone Encounter (Signed)
Received vm at 3:29 from patient. She would like her lab order placed so [facility]. Please advise when the orders are in so she can schedule the lab appointment

## 2021-07-31 DIAGNOSIS — I10 Essential (primary) hypertension: Secondary | ICD-10-CM | POA: Diagnosis not present

## 2021-07-31 DIAGNOSIS — E785 Hyperlipidemia, unspecified: Secondary | ICD-10-CM | POA: Diagnosis not present

## 2021-07-31 LAB — CBC
HCT: 42.4 % (ref 35.0–45.0)
Hemoglobin: 13.5 g/dL (ref 11.7–15.5)
MCH: 27.5 pg (ref 27.0–33.0)
MCHC: 31.8 g/dL — ABNORMAL LOW (ref 32.0–36.0)
MCV: 86.4 fL (ref 80.0–100.0)
MPV: 10.1 fL (ref 7.5–12.5)
Platelets: 277 10*3/uL (ref 140–400)
RBC: 4.91 10*6/uL (ref 3.80–5.10)
RDW: 11.9 % (ref 11.0–15.0)
WBC: 6.2 10*3/uL (ref 3.8–10.8)

## 2021-07-31 LAB — LIPID PANEL
Cholesterol: 208 mg/dL — ABNORMAL HIGH (ref <200)
HDL: 59 mg/dL (ref >=50)
LDL Cholesterol (Calc): 133 mg/dL (calc) — ABNORMAL HIGH
Non-HDL Cholesterol (Calc): 149 mg/dL (calc) — ABNORMAL HIGH (ref <130)
Total CHOL/HDL Ratio: 3.5 (calc) (ref <5.0)
Triglycerides: 66 mg/dL (ref <150)

## 2021-07-31 LAB — COMPLETE METABOLIC PANEL WITH GFR
AG Ratio: 1.8 (calc) (ref 1.0–2.5)
ALT: 10 U/L (ref 6–29)
AST: 15 U/L (ref 10–35)
Albumin: 4 g/dL (ref 3.6–5.1)
Alkaline phosphatase (APISO): 50 U/L (ref 37–153)
BUN: 20 mg/dL (ref 7–25)
CO2: 31 mmol/L (ref 20–32)
Calcium: 9.1 mg/dL (ref 8.6–10.4)
Chloride: 105 mmol/L (ref 98–110)
Creat: 0.73 mg/dL (ref 0.50–1.05)
Globulin: 2.2 g/dL (calc) (ref 1.9–3.7)
Glucose, Bld: 90 mg/dL (ref 65–99)
Potassium: 4.3 mmol/L (ref 3.5–5.3)
Sodium: 141 mmol/L (ref 135–146)
Total Bilirubin: 0.4 mg/dL (ref 0.2–1.2)
Total Protein: 6.2 g/dL (ref 6.1–8.1)
eGFR: 90 mL/min/{1.73_m2} (ref >=60)

## 2021-09-07 ENCOUNTER — Encounter: Payer: Self-pay | Admitting: Family Medicine

## 2021-09-07 ENCOUNTER — Ambulatory Visit (INDEPENDENT_AMBULATORY_CARE_PROVIDER_SITE_OTHER): Payer: Federal, State, Local not specified - PPO | Admitting: Family Medicine

## 2021-09-07 VITALS — BP 125/82 | HR 78 | Temp 98.6°F | Ht 64.25 in | Wt 131.0 lb

## 2021-09-07 DIAGNOSIS — R7303 Prediabetes: Secondary | ICD-10-CM

## 2021-09-07 DIAGNOSIS — I10 Essential (primary) hypertension: Secondary | ICD-10-CM

## 2021-09-07 DIAGNOSIS — M810 Age-related osteoporosis without current pathological fracture: Secondary | ICD-10-CM | POA: Diagnosis not present

## 2021-09-07 DIAGNOSIS — E785 Hyperlipidemia, unspecified: Secondary | ICD-10-CM

## 2021-09-07 DIAGNOSIS — Z0000 Encounter for general adult medical examination without abnormal findings: Secondary | ICD-10-CM

## 2021-09-07 DIAGNOSIS — Z1231 Encounter for screening mammogram for malignant neoplasm of breast: Secondary | ICD-10-CM

## 2021-09-07 LAB — POCT GLYCOSYLATED HEMOGLOBIN (HGB A1C): Hemoglobin A1C: 5.6 % (ref 4.0–5.6)

## 2021-09-07 NOTE — Patient Instructions (Signed)
Preventive Care 34 Years and Older, Female ?Preventive care refers to lifestyle choices and visits with your health care provider that can promote health and wellness. Preventive care visits are also called wellness exams. ?What can I expect for my preventive care visit? ?Counseling ?Your health care provider may ask you questions about your: ?Medical history, including: ?Past medical problems. ?Family medical history. ?Pregnancy and menstrual history. ?History of falls. ?Current health, including: ?Memory and ability to understand (cognition). ?Emotional well-being. ?Home life and relationship well-being. ?Sexual activity and sexual health. ?Lifestyle, including: ?Alcohol, nicotine or tobacco, and drug use. ?Access to firearms. ?Diet, exercise, and sleep habits. ?Work and work Statistician. ?Sunscreen use. ?Safety issues such as seatbelt and bike helmet use. ?Physical exam ?Your health care provider will check your: ?Height and weight. These may be used to calculate your BMI (body mass index). BMI is a measurement that tells if you are at a healthy weight. ?Waist circumference. This measures the distance around your waistline. This measurement also tells if you are at a healthy weight and may help predict your risk of certain diseases, such as type 2 diabetes and high blood pressure. ?Heart rate and blood pressure. ?Body temperature. ?Skin for abnormal spots. ?What immunizations do I need? ?Vaccines are usually given at various ages, according to a schedule. Your health care provider will recommend vaccines for you based on your age, medical history, and lifestyle or other factors, such as travel or where you work. ?What tests do I need? ?Screening ?Your health care provider may recommend screening tests for certain conditions. This may include: ?Lipid and cholesterol levels. ?Hepatitis C test. ?Hepatitis B test. ?HIV (human immunodeficiency virus) test. ?STI (sexually transmitted infection) testing, if you are at  risk. ?Lung cancer screening. ?Colorectal cancer screening. ?Diabetes screening. This is done by checking your blood sugar (glucose) after you have not eaten for a while (fasting). ?Mammogram. Talk with your health care provider about how often you should have regular mammograms. ?BRCA-related cancer screening. This may be done if you have a family history of breast, ovarian, tubal, or peritoneal cancers. ?Bone density scan. This is done to screen for osteoporosis. ?Talk with your health care provider about your test results, treatment options, and if necessary, the need for more tests. ?Follow these instructions at home: ?Eating and drinking ? ?Eat a diet that includes fresh fruits and vegetables, whole grains, lean protein, and low-fat dairy products. Limit your intake of foods with high amounts of sugar, saturated fats, and salt. ?Take vitamin and mineral supplements as recommended by your health care provider. ?Do not drink alcohol if your health care provider tells you not to drink. ?If you drink alcohol: ?Limit how much you have to 0-1 drink a day. ?Know how much alcohol is in your drink. In the [place] one drink equals one 12 oz bottle of beer (355 mL), one 5 oz glass of wine (148 mL), or one 1? oz glass of hard liquor (44 mL). ?Lifestyle ?Brush your teeth every morning and night with fluoride toothpaste. Floss one time each day. ?Exercise for at least 30 minutes 5 or more days each week. ?Do not use any products that contain nicotine or tobacco. These products include cigarettes, chewing tobacco, and vaping devices, such as e-cigarettes. If you need help quitting, ask your health care provider. ?Do not use drugs. ?If you are sexually active, practice safe sex. Use a condom or other form of protection in order to prevent STIs. ?Take aspirin only as told by your  health care provider. Make sure that you understand how much to take and what form to take. Work with your health care provider to find out whether it  is safe and beneficial for you to take aspirin daily. ?Ask your health care provider if you need to take a cholesterol-lowering medicine (statin). ?Find healthy ways to manage stress, such as: ?Meditation, yoga, or listening to music. ?Journaling. ?Talking to a trusted person. ?Spending time with friends and family. ?Minimize exposure to UV radiation to reduce your risk of skin cancer. ?Safety ?Always wear your seat belt while driving or riding in a vehicle. ?Do not drive: ?If you have been drinking alcohol. Do not ride with someone who has been drinking. ?When you are tired or distracted. ?While texting. ?If you have been using any mind-altering substances or drugs. ?Wear a helmet and other protective equipment during sports activities. ?If you have firearms in your house, make sure you follow all gun safety procedures. ?What's next? ?Visit your health care provider once a year for an annual wellness visit. ?Ask your health care provider how often you should have your eyes and teeth checked. ?Stay up to date on all vaccines. ?This information is not intended to replace advice given to you by your health care provider. Make sure you discuss any questions you have with your health care provider. ?Document Revised: 11/23/2020 Document Reviewed: 11/23/2020 ?Elsevier Patient Education ? [place]. ? ?

## 2021-09-09 DIAGNOSIS — Z0000 Encounter for general adult medical examination without abnormal findings: Secondary | ICD-10-CM | POA: Insufficient documentation

## 2021-09-09 NOTE — Progress Notes (Signed)
?Carolyn Berry - 66 y.o. female MRN [id number]  Date of birth: 16-Sep-1955 ? ?Subjective ?Chief Complaint  ?Patient presents with  ? Transitions Of Care  ? Annual Exam  ? ? ?HPI ?Carolyn Berry is a 66 year old female here today for annual exam.  She is transferring care from Dr. Sheppard Coil.  She had labs completed prior to visit today which we reviewed.  She does have elevated cholesterol with good HDL to total cholesterol ratio.  She would like to have A1c added to previous labs as well. ? ?She has her stay fairly active.  She feels like her diet is pretty healthy. ? ?She has a non-smoker.  She rarely consumes alcohol. ? ?She is due for updated mammogram.  She is also due for updated bone density test. ? ?Review of Systems  ?Constitutional:  Negative for chills, fever, malaise/fatigue and weight loss.  ?HENT:  Negative for congestion, ear pain and sore throat.   ?Eyes:  Negative for blurred vision, double vision and pain.  ?Respiratory:  Negative for cough and shortness of breath.   ?Cardiovascular:  Negative for chest pain and palpitations.  ?Gastrointestinal:  Negative for abdominal pain, blood in stool, constipation, heartburn and nausea.  ?Genitourinary:  Negative for dysuria and urgency.  ?Musculoskeletal:  Negative for joint pain and myalgias.  ?Neurological:  Negative for dizziness and headaches.  ?Endo/Heme/Allergies:  Does not bruise/bleed easily.  ?Psychiatric/Behavioral:  Negative for depression. The patient is not nervous/anxious and does not have insomnia.   ? ? ?No Known Allergies ? ?Past Medical History:  ?Diagnosis Date  ? Elevated blood-pressure reading without diagnosis of hypertension   ? Hypertension   ? Osteoporosis   ? osteopenis  ? ? ?Past Surgical History:  ?Procedure Laterality Date  ? COLONOSCOPY  2011  ? POLYPECTOMY    ? skin tumor removal ?lipoma - benign    ? TUBAL LIGATION  1985  ? ? ?Social History  ? ?Socioeconomic History  ? Marital status: Married  ?  Spouse name:  Not on file  ? Number of children: Not on file  ? Years of education: Not on file  ? Highest education level: Not on file  ?Occupational History  ? Not on file  ?Tobacco Use  ? Smoking status: Never  ? Smokeless tobacco: Never  ?Vaping Use  ? Vaping Use: Never used  ?Substance and Sexual Activity  ? Alcohol use: Yes  ?  Comment: rare use, social   ? Drug use: No  ? Sexual activity: Yes  ?  Partners: Male  ?Other Topics Concern  ? Not on file  ?Social History Narrative  ? Not on file  ? ?Social Determinants of Health  ? ?Financial Resource Strain: Not on file  ?Food Insecurity: Not on file  ?Transportation Needs: Not on file  ?Physical Activity: Not on file  ?Stress: Not on file  ?Social Connections: Not on file  ? ? ?Family History  ?Problem Relation Age of Onset  ? Alcoholism Other   ?     brother  ? Cancer Mother   ? Hypertension Mother   ? Melanoma Mother   ? Hypertension Father   ? Colon cancer Neg Hx   ? Colon polyps Neg Hx   ? Esophageal cancer Neg Hx   ? Rectal cancer Neg Hx   ? Stomach cancer Neg Hx   ? ? ?Health Maintenance  ?Topic Date Due  ? Hepatitis C Screening  Never done  ? Pneumonia Vaccine 49+ Years old (  2 - PCV) 04/29/2020  ? INFLUENZA VACCINE  01/09/2022  ? MAMMOGRAM  10/14/2022  ? COLONOSCOPY (Pts 45-59yr Insurance coverage will need to be confirmed)  02/04/2027  ? TETANUS/TDAP  03/22/2030  ? DEXA SCAN  Completed  ? COVID-19 Vaccine  Completed  ? Zoster Vaccines- Shingrix  Completed  ? HPV VACCINES  Aged Out  ? ? ? ?----------------------------------------------------------------------------------------------------------------------------------------------------------------------------------------------------------------- ?Physical Exam ?BP 125/82 (BP Location: Left Arm, Patient Position: Sitting, Cuff Size: Small)   Pulse 78   Temp 98.6 ?F (37 ?C) (Oral)   Ht 5' 4.25" (1.632 m)   Wt 131 lb (59.4 kg)   SpO2 100%   BMI 22.31 kg/m?  ? ?Physical Exam ?Constitutional:   ?   General: She is not  in acute distress. ?HENT:  ?   Head: Normocephalic and atraumatic.  ?   Right Ear: Tympanic membrane and ear canal normal.  ?   Left Ear: Tympanic membrane and ear canal normal.  ?   Nose: Nose normal.  ?Eyes:  ?   General: No scleral icterus. ?   Conjunctiva/sclera: Conjunctivae normal.  ?Neck:  ?   Thyroid: No thyromegaly.  ?Cardiovascular:  ?   Rate and Rhythm: Normal rate and regular rhythm.  ?   Heart sounds: Normal heart sounds.  ?Pulmonary:  ?   Effort: Pulmonary effort is normal.  ?   Breath sounds: Normal breath sounds.  ?Abdominal:  ?   General: Bowel sounds are normal. There is no distension.  ?   Palpations: Abdomen is soft.  ?   Tenderness: There is no abdominal tenderness. There is no guarding.  ?Musculoskeletal:     ?   General: Normal range of motion.  ?   Cervical back: Normal range of motion and neck supple.  ?Lymphadenopathy:  ?   Cervical: No cervical adenopathy.  ?Skin: ?   General: Skin is warm and dry.  ?   Findings: No rash.  ?Neurological:  ?   General: No focal deficit present.  ?   Mental Status: She is alert and oriented to person, place, and time.  ?   Cranial Nerves: No cranial nerve deficit.  ?   Coordination: Coordination normal.  ?Psychiatric:     ?   Mood and Affect: Mood normal.     ?   Behavior: Behavior normal.  ? ? ?------------------------------------------------------------------------------------------------------------------------------------------------------------------------------------------------------------------- ?Assessment and Plan ? ?Prediabetes ?Updated A1c completed today.  Blood sugars remain well controlled. ?Lab Results  ?Component Value Date  ? HGBA1C 5.6 09/07/2021  ?' ? ?Well adult exam ?Well adult ?Orders Placed This Encounter  ?Procedures  ? MM 3D SCREEN BREAST BILATERAL  ?  Standing Status:   Future  ?  Standing Expiration Date:   09/08/2022  ?  Order Specific Question:   Reason for Exam (SYMPTOM  OR DIAGNOSIS REQUIRED)  ?  Answer:   Screening for breast  cancer  ?  Order Specific Question:   Preferred imaging location?  ?  Answer:   [facility] ? DG Bone Density  ?  Standing Status:   Future  ?  Standing Expiration Date:   09/08/2022  ?  Order Specific Question:   Reason for Exam (SYMPTOM  OR DIAGNOSIS REQUIRED)  ?  Answer:   Osteoporosis  ?  Order Specific Question:   Preferred imaging location?  ?  Answer:   [facility] ? POCT HgB A1C  ?Screenings: Mammogram ordered, updated DEXA ordered. ?Immunizations: Up-to-date ?Anticipatory guidance/risk factor reduction: Recommendations per AVS. ? ? ?  No orders of the defined types were placed in this encounter. ? ? ?No follow-ups on file. ? ? ? ?This visit occurred during the SARS-CoV-2 public health emergency.  Safety protocols were in place, including screening questions prior to the visit, additional usage of staff PPE, and extensive cleaning of exam room while observing appropriate contact time as indicated for disinfecting solutions.  ? ?

## 2021-09-09 NOTE — Assessment & Plan Note (Signed)
Well adult ?Orders Placed This Encounter  ?Procedures  ?? MM 3D SCREEN BREAST BILATERAL  ?  Standing Status:   Future  ?  Standing Expiration Date:   09/08/2022  ?  Order Specific Question:   Reason for Exam (SYMPTOM  OR DIAGNOSIS REQUIRED)  ?  Answer:   Screening for breast cancer  ?  Order Specific Question:   Preferred imaging location?  ?  Answer:   [facility]  ?? DG Bone Density  ?  Standing Status:   Future  ?  Standing Expiration Date:   09/08/2022  ?  Order Specific Question:   Reason for Exam (SYMPTOM  OR DIAGNOSIS REQUIRED)  ?  Answer:   Osteoporosis  ?  Order Specific Question:   Preferred imaging location?  ?  Answer:   [facility]  ?? POCT HgB A1C  ?Screenings: Mammogram ordered, updated DEXA ordered. ?Immunizations: Up-to-date ?Anticipatory guidance/risk factor reduction: Recommendations per AVS. ? ?

## 2021-09-09 NOTE — Assessment & Plan Note (Signed)
Updated A1c completed today.  Blood sugars remain well controlled. ?Lab Results  ?Component Value Date  ? HGBA1C 5.6 09/07/2021  ?' ?

## 2021-09-14 DIAGNOSIS — D225 Melanocytic nevi of trunk: Secondary | ICD-10-CM | POA: Diagnosis not present

## 2021-09-14 DIAGNOSIS — D235 Other benign neoplasm of skin of trunk: Secondary | ICD-10-CM | POA: Diagnosis not present

## 2021-09-14 DIAGNOSIS — L821 Other seborrheic keratosis: Secondary | ICD-10-CM | POA: Diagnosis not present

## 2021-09-14 DIAGNOSIS — L820 Inflamed seborrheic keratosis: Secondary | ICD-10-CM | POA: Diagnosis not present

## 2021-10-11 ENCOUNTER — Ambulatory Visit (INDEPENDENT_AMBULATORY_CARE_PROVIDER_SITE_OTHER): Payer: Federal, State, Local not specified - PPO

## 2021-10-11 DIAGNOSIS — M810 Age-related osteoporosis without current pathological fracture: Secondary | ICD-10-CM

## 2021-10-11 DIAGNOSIS — Z0000 Encounter for general adult medical examination without abnormal findings: Secondary | ICD-10-CM

## 2021-11-16 ENCOUNTER — Ambulatory Visit (INDEPENDENT_AMBULATORY_CARE_PROVIDER_SITE_OTHER): Payer: Federal, State, Local not specified - PPO

## 2021-11-16 DIAGNOSIS — Z0000 Encounter for general adult medical examination without abnormal findings: Secondary | ICD-10-CM | POA: Diagnosis not present

## 2021-11-16 DIAGNOSIS — Z1231 Encounter for screening mammogram for malignant neoplasm of breast: Secondary | ICD-10-CM | POA: Diagnosis not present

## 2022-01-12 ENCOUNTER — Other Ambulatory Visit: Payer: Self-pay

## 2022-01-12 DIAGNOSIS — I10 Essential (primary) hypertension: Secondary | ICD-10-CM

## 2022-01-12 MED ORDER — LISINOPRIL-HYDROCHLOROTHIAZIDE 10-12.5 MG PO TABS: 1.0000 | ORAL_TABLET | Freq: Every day | ORAL | 3 refills | Status: DC

## 2022-09-18 DIAGNOSIS — D225 Melanocytic nevi of trunk: Secondary | ICD-10-CM | POA: Diagnosis not present

## 2022-09-18 DIAGNOSIS — L821 Other seborrheic keratosis: Secondary | ICD-10-CM | POA: Diagnosis not present

## 2022-09-18 DIAGNOSIS — D235 Other benign neoplasm of skin of trunk: Secondary | ICD-10-CM | POA: Diagnosis not present

## 2022-09-18 DIAGNOSIS — L814 Other melanin hyperpigmentation: Secondary | ICD-10-CM | POA: Diagnosis not present

## 2022-10-23 ENCOUNTER — Other Ambulatory Visit: Payer: Self-pay | Admitting: Family Medicine

## 2022-10-23 DIAGNOSIS — I10 Essential (primary) hypertension: Secondary | ICD-10-CM

## 2022-10-24 NOTE — Telephone Encounter (Signed)
Pls contact the patient to schedule appt. Last seen 08/2021. Sending 30 day refill. Thanks

## 2022-10-24 NOTE — Telephone Encounter (Signed)
Called pt and left a voicemail for her to call and schedule.

## 2022-10-31 ENCOUNTER — Telehealth: Payer: Self-pay | Admitting: Family Medicine

## 2022-10-31 NOTE — Telephone Encounter (Signed)
Patient called. She is requesting labwork to include A1c prior to her physical scheduled for 6/19.

## 2022-11-07 ENCOUNTER — Telehealth: Payer: Self-pay | Admitting: Family Medicine

## 2022-11-07 DIAGNOSIS — Z0000 Encounter for general adult medical examination without abnormal findings: Secondary | ICD-10-CM

## 2022-11-07 DIAGNOSIS — E785 Hyperlipidemia, unspecified: Secondary | ICD-10-CM

## 2022-11-07 DIAGNOSIS — I10 Essential (primary) hypertension: Secondary | ICD-10-CM

## 2022-11-07 DIAGNOSIS — R7303 Prediabetes: Secondary | ICD-10-CM

## 2022-11-07 DIAGNOSIS — M8580 Other specified disorders of bone density and structure, unspecified site: Secondary | ICD-10-CM

## 2022-11-07 NOTE — Telephone Encounter (Signed)
Patient called and requested labs including an A1C been ordered before her appointment on 11-28-22

## 2022-11-07 NOTE — Telephone Encounter (Signed)
Lab orders entered

## 2022-11-13 ENCOUNTER — Other Ambulatory Visit: Payer: Self-pay | Admitting: Family Medicine

## 2022-11-13 DIAGNOSIS — Z1231 Encounter for screening mammogram for malignant neoplasm of breast: Secondary | ICD-10-CM

## 2022-11-19 ENCOUNTER — Other Ambulatory Visit: Payer: Federal, State, Local not specified - PPO

## 2022-11-19 DIAGNOSIS — M8580 Other specified disorders of bone density and structure, unspecified site: Secondary | ICD-10-CM | POA: Diagnosis not present

## 2022-11-19 DIAGNOSIS — I10 Essential (primary) hypertension: Secondary | ICD-10-CM | POA: Diagnosis not present

## 2022-11-19 DIAGNOSIS — E785 Hyperlipidemia, unspecified: Secondary | ICD-10-CM | POA: Diagnosis not present

## 2022-11-19 DIAGNOSIS — R7303 Prediabetes: Secondary | ICD-10-CM | POA: Diagnosis not present

## 2022-11-20 LAB — CBC WITH DIFFERENTIAL/PLATELET
Absolute Monocytes: 318 cells/uL (ref 200–950)
Basophils Absolute: 30 cells/uL (ref 0–200)
Basophils Relative: 0.5 %
Eosinophils Absolute: 72 cells/uL (ref 15–500)
Eosinophils Relative: 1.2 %
HCT: 44.2 % (ref 35.0–45.0)
Hemoglobin: 13.8 g/dL (ref 11.7–15.5)
Lymphs Abs: 1590 cells/uL (ref 850–3900)
MCH: 26.7 pg — ABNORMAL LOW (ref 27.0–33.0)
MCHC: 31.2 g/dL — ABNORMAL LOW (ref 32.0–36.0)
MCV: 85.7 fL (ref 80.0–100.0)
MPV: 10.1 fL (ref 7.5–12.5)
Monocytes Relative: 5.3 %
Neutro Abs: 3990 cells/uL (ref 1500–7800)
Neutrophils Relative %: 66.5 %
Platelets: 262 10*3/uL (ref 140–400)
RBC: 5.16 10*6/uL — ABNORMAL HIGH (ref 3.80–5.10)
RDW: 11.9 % (ref 11.0–15.0)
Total Lymphocyte: 26.5 %
WBC: 6 10*3/uL (ref 3.8–10.8)

## 2022-11-20 LAB — LIPID PANEL W/REFLEX DIRECT LDL
Cholesterol: 193 mg/dL (ref <200)
HDL: 63 mg/dL (ref >=50)
LDL Cholesterol (Calc): 110 mg/dL (calc) — ABNORMAL HIGH
Non-HDL Cholesterol (Calc): 130 mg/dL (calc) — ABNORMAL HIGH (ref <130)
Total CHOL/HDL Ratio: 3.1 (calc) (ref <5.0)
Triglycerides: 95 mg/dL (ref <150)

## 2022-11-20 LAB — COMPLETE METABOLIC PANEL WITH GFR
AG Ratio: 2.5 (calc) (ref 1.0–2.5)
ALT: 9 U/L (ref 6–29)
AST: 11 U/L (ref 10–35)
Albumin: 4.5 g/dL (ref 3.6–5.1)
Alkaline phosphatase (APISO): 54 U/L (ref 37–153)
BUN: 18 mg/dL (ref 7–25)
CO2: 31 mmol/L (ref 20–32)
Calcium: 9.7 mg/dL (ref 8.6–10.4)
Chloride: 105 mmol/L (ref 98–110)
Creat: 0.67 mg/dL (ref 0.50–1.05)
Globulin: 1.8 g/dL (calc) — ABNORMAL LOW (ref 1.9–3.7)
Glucose, Bld: 89 mg/dL (ref 65–99)
Potassium: 4.1 mmol/L (ref 3.5–5.3)
Sodium: 143 mmol/L (ref 135–146)
Total Bilirubin: 0.5 mg/dL (ref 0.2–1.2)
Total Protein: 6.3 g/dL (ref 6.1–8.1)
eGFR: 95 mL/min/{1.73_m2} (ref >=60)

## 2022-11-20 LAB — HEMOGLOBIN A1C
Hgb A1c MFr Bld: 5.9 % of total Hgb — ABNORMAL HIGH (ref <5.7)
Mean Plasma Glucose: 123 mg/dL
eAG (mmol/L): 6.8 mmol/L

## 2022-11-20 LAB — VITAMIN D 25 HYDROXY (VIT D DEFICIENCY, FRACTURES): Vit D, 25-Hydroxy: 54 ng/mL (ref 30–100)

## 2022-11-27 ENCOUNTER — Ambulatory Visit (INDEPENDENT_AMBULATORY_CARE_PROVIDER_SITE_OTHER): Payer: Federal, State, Local not specified - PPO

## 2022-11-27 DIAGNOSIS — Z1231 Encounter for screening mammogram for malignant neoplasm of breast: Secondary | ICD-10-CM

## 2022-11-28 ENCOUNTER — Ambulatory Visit (INDEPENDENT_AMBULATORY_CARE_PROVIDER_SITE_OTHER): Payer: Federal, State, Local not specified - PPO | Admitting: Family Medicine

## 2022-11-28 ENCOUNTER — Encounter: Payer: Self-pay | Admitting: Family Medicine

## 2022-11-28 VITALS — BP 118/76 | HR 74 | Ht 64.25 in | Wt 133.0 lb

## 2022-11-28 DIAGNOSIS — Z0000 Encounter for general adult medical examination without abnormal findings: Secondary | ICD-10-CM

## 2022-11-28 DIAGNOSIS — Z23 Encounter for immunization: Secondary | ICD-10-CM | POA: Diagnosis not present

## 2022-11-28 DIAGNOSIS — I10 Essential (primary) hypertension: Secondary | ICD-10-CM

## 2022-11-28 MED ORDER — LISINOPRIL-HYDROCHLOROTHIAZIDE 10-12.5 MG PO TABS: 1.0000 | ORAL_TABLET | Freq: Every day | ORAL | 3 refills | Status: DC

## 2022-11-28 NOTE — Patient Instructions (Signed)
Preventive Care 65 Years and Older, Female Preventive care refers to lifestyle choices and visits with your health care provider that can promote health and wellness. Preventive care visits are also called wellness exams. What can I expect for my preventive care visit? Counseling Your health care provider may ask you questions about your: Medical history, including: Past medical problems. Family medical history. Pregnancy and menstrual history. History of falls. Current health, including: Memory and ability to understand (cognition). Emotional well-being. Home life and relationship well-being. Sexual activity and sexual health. Lifestyle, including: Alcohol, nicotine or tobacco, and drug use. Access to firearms. Diet, exercise, and sleep habits. Work and work environment. Sunscreen use. Safety issues such as seatbelt and bike helmet use. Physical exam Your health care provider will check your: Height and weight. These may be used to calculate your BMI (body mass index). BMI is a measurement that tells if you are at a healthy weight. Waist circumference. This measures the distance around your waistline. This measurement also tells if you are at a healthy weight and may help predict your risk of certain diseases, such as type 2 diabetes and high blood pressure. Heart rate and blood pressure. Body temperature. Skin for abnormal spots. What immunizations do I need?  Vaccines are usually given at various ages, according to a schedule. Your health care provider will recommend vaccines for you based on your age, medical history, and lifestyle or other factors, such as travel or where you work. What tests do I need? Screening Your health care provider may recommend screening tests for certain conditions. This may include: Lipid and cholesterol levels. Hepatitis C test. Hepatitis B test. HIV (human immunodeficiency virus) test. STI (sexually transmitted infection) testing, if you are at  risk. Lung cancer screening. Colorectal cancer screening. Diabetes screening. This is done by checking your blood sugar (glucose) after you have not eaten for a while (fasting). Mammogram. Talk with your health care provider about how often you should have regular mammograms. BRCA-related cancer screening. This may be done if you have a family history of breast, ovarian, tubal, or peritoneal cancers. Bone density scan. This is done to screen for osteoporosis. Talk with your health care provider about your test results, treatment options, and if necessary, the need for more tests. Follow these instructions at home: Eating and drinking  Eat a diet that includes fresh fruits and vegetables, whole grains, lean protein, and low-fat dairy products. Limit your intake of foods with high amounts of sugar, saturated fats, and salt. Take vitamin and mineral supplements as recommended by your health care provider. Do not drink alcohol if your health care provider tells you not to drink. If you drink alcohol: Limit how much you have to 0-1 drink a day. Know how much alcohol is in your drink. In the [place] one drink equals one 12 oz bottle of beer (355 mL), one 5 oz glass of wine (148 mL), or one 1 oz glass of hard liquor (44 mL). Lifestyle Brush your teeth every morning and night with fluoride toothpaste. Floss one time each day. Exercise for at least 30 minutes 5 or more days each week. Do not use any products that contain nicotine or tobacco. These products include cigarettes, chewing tobacco, and vaping devices, such as e-cigarettes. If you need help quitting, ask your health care provider. Do not use drugs. If you are sexually active, practice safe sex. Use a condom or other form of protection in order to prevent STIs. Take aspirin only as told by   your health care provider. Make sure that you understand how much to take and what form to take. Work with your health care provider to find out whether it  is safe and beneficial for you to take aspirin daily. Ask your health care provider if you need to take a cholesterol-lowering medicine (statin). Find healthy ways to manage stress, such as: Meditation, yoga, or listening to music. Journaling. Talking to a trusted person. Spending time with friends and family. Minimize exposure to UV radiation to reduce your risk of skin cancer. Safety Always wear your seat belt while driving or riding in a vehicle. Do not drive: If you have been drinking alcohol. Do not ride with someone who has been drinking. When you are tired or distracted. While texting. If you have been using any mind-altering substances or drugs. Wear a helmet and other protective equipment during sports activities. If you have firearms in your house, make sure you follow all gun safety procedures. What's next? Visit your health care provider once a year for an annual wellness visit. Ask your health care provider how often you should have your eyes and teeth checked. Stay up to date on all vaccines. This information is not intended to replace advice given to you by your health care provider. Make sure you discuss any questions you have with your health care provider. Document Revised: 11/23/2020 Document Reviewed: 11/23/2020 Elsevier Patient Education  2024 [employer]  

## 2022-11-28 NOTE — Progress Notes (Signed)
Carolyn Berry - 67 y.o. female MRN [id number]  Date of birth: 24-Sep-1955  Subjective Chief Complaint  Patient presents with   Annual Exam    HPI Carolyn Berry is a 67 y.o. female here today for annual exam.    She reports that she is doing well at this time.  She continues on lisinopril/hydrochlorothiazide for management of HTN.  BP remains well controlled.   Her activity levels is moderate.  She feel that diet is pretty good.   She is a non-smoker.  Rare EtOH use.   She is due for pneumonia vaccination.  She would like to have this completed today.   Review of Systems  Constitutional:  Negative for chills, fever, malaise/fatigue and weight loss.  HENT:  Negative for congestion, ear pain and sore throat.   Eyes:  Negative for blurred vision, double vision and pain.  Respiratory:  Negative for cough and shortness of breath.   Cardiovascular:  Negative for chest pain and palpitations.  Gastrointestinal:  Negative for abdominal pain, blood in stool, constipation, heartburn and nausea.  Genitourinary:  Negative for dysuria and urgency.  Musculoskeletal:  Negative for joint pain and myalgias.  Neurological:  Negative for dizziness and headaches.  Endo/Heme/Allergies:  Does not bruise/bleed easily.  Psychiatric/Behavioral:  Negative for depression. The patient is not nervous/anxious and does not have insomnia.     No Known Allergies  Past Medical History:  Diagnosis Date   Elevated blood-pressure reading without diagnosis of hypertension    Hypertension    Osteoporosis    osteopenis    Past Surgical History:  Procedure Laterality Date   COLONOSCOPY  2011   POLYPECTOMY     skin tumor removal ?lipoma - benign     TUBAL LIGATION  1985    Social History   Socioeconomic History   Marital status: Married    Spouse name: Not on file   Number of children: Not on file   Years of education: Not on file   Highest education level: Not on file   Occupational History   Not on file  Tobacco Use   Smoking status: Never   Smokeless tobacco: Never  Vaping Use   Vaping Use: Never used  Substance and Sexual Activity   Alcohol use: Yes    Comment: rare use, social    Drug use: No   Sexual activity: Yes    Partners: Male  Other Topics Concern   Not on file  Social History Narrative   Not on file   Social Determinants of Health   Financial Resource Strain: Not on file  Food Insecurity: Not on file  Transportation Needs: Not on file  Physical Activity: Not on file  Stress: Not on file  Social Connections: Not on file    Family History  Problem Relation Age of Onset   Alcoholism Other        brother   Cancer Mother    Hypertension Mother    Melanoma Mother    Hypertension Father    Colon cancer Neg Hx    Colon polyps Neg Hx    Esophageal cancer Neg Hx    Rectal cancer Neg Hx    Stomach cancer Neg Hx     Health Maintenance  Topic Date Due   Pneumonia Vaccine 52+ Years old (2 of 2 - PCV) 04/29/2020   Hepatitis C Screening  11/28/2023 (Originally 12/01/1973)   COVID-19 Vaccine (6 - 2023-24 season) 02/14/2024 (Originally 02/09/2022)   INFLUENZA VACCINE  01/10/2023   MAMMOGRAM  11/17/2023   Colonoscopy  02/04/2027   DTaP/Tdap/Td (3 - Td or Tdap) 03/22/2030   DEXA SCAN  Completed   Zoster Vaccines- Shingrix  Completed   HPV VACCINES  Aged Out     ----------------------------------------------------------------------------------------------------------------------------------------------------------------------------------------------------------------- Physical Exam BP 118/76 (BP Location: Left Arm, Patient Position: Sitting, Cuff Size: Small)   Pulse 74   Ht 5' 4.25" (1.632 m)   Wt 133 lb (60.3 kg)   SpO2 97%   BMI 22.65 kg/m   Physical Exam Constitutional:      General: She is not in acute distress. HENT:     Head: Normocephalic and atraumatic.     Right Ear: Tympanic membrane and ear canal normal.      Left Ear: Tympanic membrane and ear canal normal.     Nose: Nose normal.  Eyes:     General: No scleral icterus.    Conjunctiva/sclera: Conjunctivae normal.  Neck:     Thyroid: No thyromegaly.  Cardiovascular:     Rate and Rhythm: Normal rate and regular rhythm.     Heart sounds: Normal heart sounds.  Pulmonary:     Effort: Pulmonary effort is normal.     Breath sounds: Normal breath sounds.  Abdominal:     General: Bowel sounds are normal. There is no distension.     Palpations: Abdomen is soft.     Tenderness: There is no abdominal tenderness. There is no guarding.  Musculoskeletal:        General: Normal range of motion.     Cervical back: Normal range of motion and neck supple.  Lymphadenopathy:     Cervical: No cervical adenopathy.  Skin:    General: Skin is warm and dry.     Findings: No rash.  Neurological:     General: No focal deficit present.     Mental Status: She is alert and oriented to person, place, and time.     Cranial Nerves: No cranial nerve deficit.     Coordination: Coordination normal.  Psychiatric:        Mood and Affect: Mood normal.        Behavior: Behavior normal.     ------------------------------------------------------------------------------------------------------------------------------------------------------------------------------------------------------------------- Assessment and Plan  Well adult exam Well adult Recent labs reviewed with her in detail.  Lipids improved.  A1c up slightly.   Immunizations:  Prevnar 20 given today.  Screenings: UTD Anticipatory guidance/Risk factor reduction:  Recommendations per AVS.    No orders of the defined types were placed in this encounter.   No follow-ups on file.    This visit occurred during the SARS-CoV-2 public health emergency.  Safety protocols were in place, including screening questions prior to the visit, additional usage of staff PPE, and extensive cleaning of exam room while  observing appropriate contact time as indicated for disinfecting solutions.

## 2022-11-28 NOTE — Assessment & Plan Note (Signed)
Well adult Recent labs reviewed with her in detail.  Lipids improved.  A1c up slightly.   Immunizations:  Prevnar 20 given today.  Screenings: UTD Anticipatory guidance/Risk factor reduction:  Recommendations per AVS.

## 2023-05-08 DIAGNOSIS — H25013 Cortical age-related cataract, bilateral: Secondary | ICD-10-CM | POA: Diagnosis not present

## 2023-05-08 DIAGNOSIS — H2512 Age-related nuclear cataract, left eye: Secondary | ICD-10-CM | POA: Diagnosis not present

## 2023-05-08 DIAGNOSIS — Q120 Congenital cataract: Secondary | ICD-10-CM | POA: Diagnosis not present

## 2023-05-08 DIAGNOSIS — I10 Essential (primary) hypertension: Secondary | ICD-10-CM | POA: Diagnosis not present

## 2023-05-08 DIAGNOSIS — H2513 Age-related nuclear cataract, bilateral: Secondary | ICD-10-CM | POA: Diagnosis not present

## 2023-05-27 DIAGNOSIS — H2511 Age-related nuclear cataract, right eye: Secondary | ICD-10-CM | POA: Diagnosis not present

## 2023-08-25 IMAGING — MG MM DIGITAL SCREENING BILAT W/ TOMO AND CAD
8 series · 8 of 24 positions shown · non-contrast
Comparison: Previous exam(s).

CLINICAL DATA: Screening.

EXAM:
DIGITAL SCREENING BILATERAL MAMMOGRAM WITH TOMOSYNTHESIS AND CAD
TECHNIQUE: Bilateral screening digital craniocaudal and mediolateral oblique
mammograms were obtained. Bilateral screening digital breast
tomosynthesis was performed. The images were evaluated with
computer-aided detection.

[L MLO synth-2D]
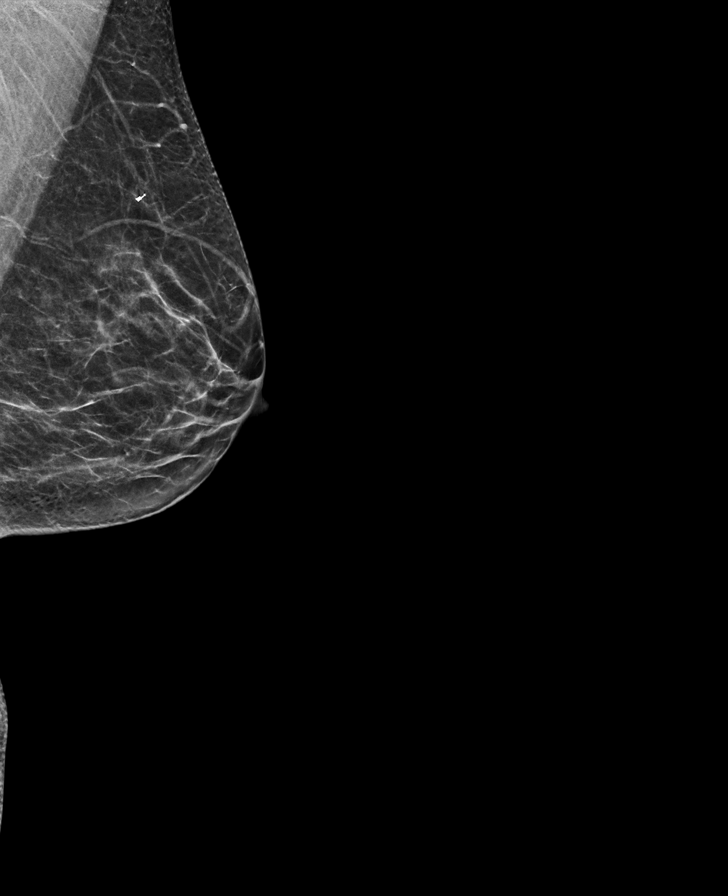

[L CC synth-2D]
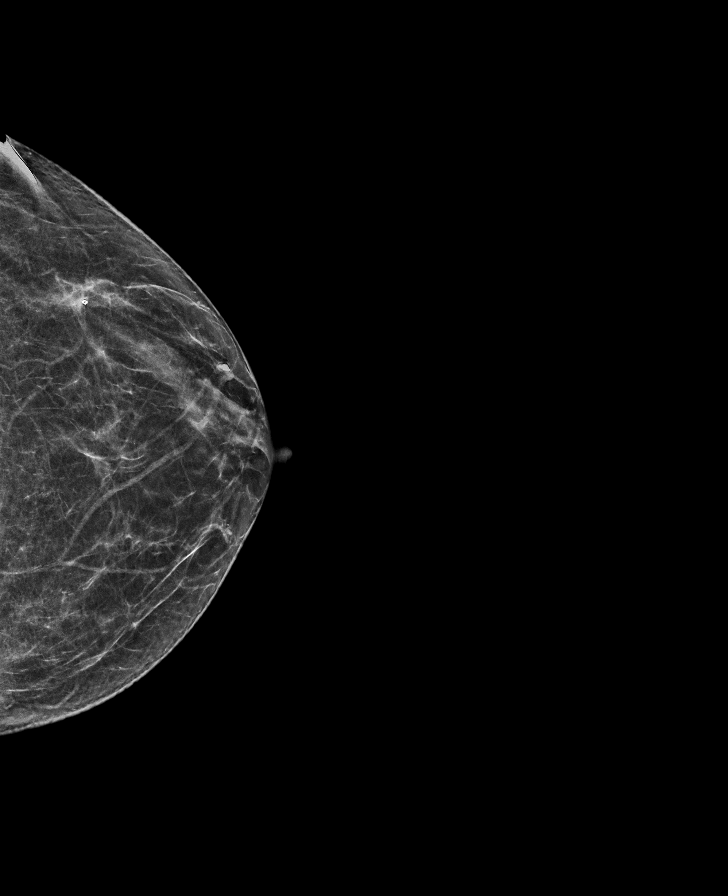

[R CC synth-2D]
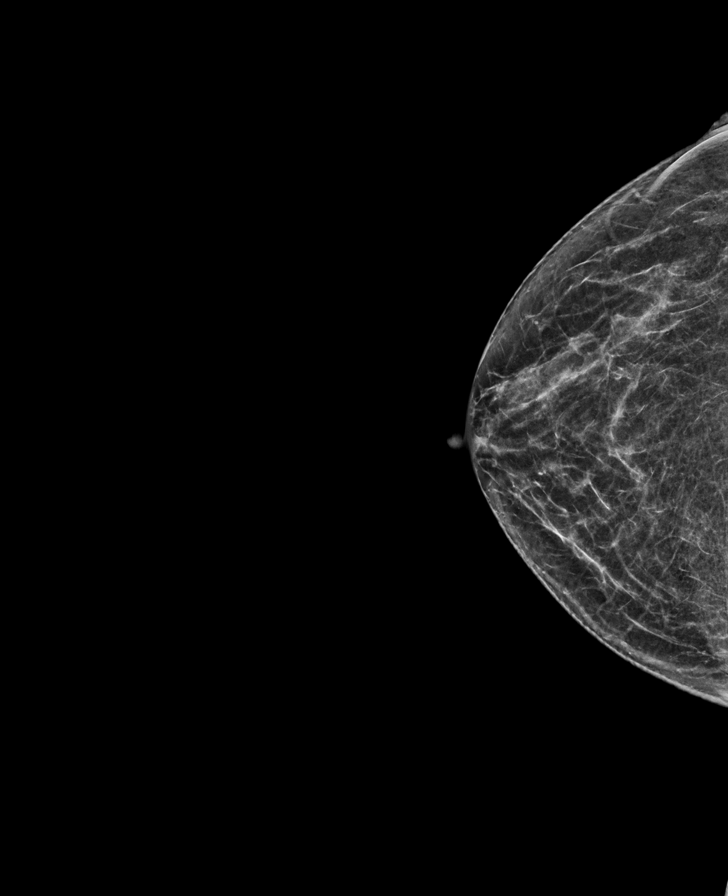

[R MLO synth-2D]
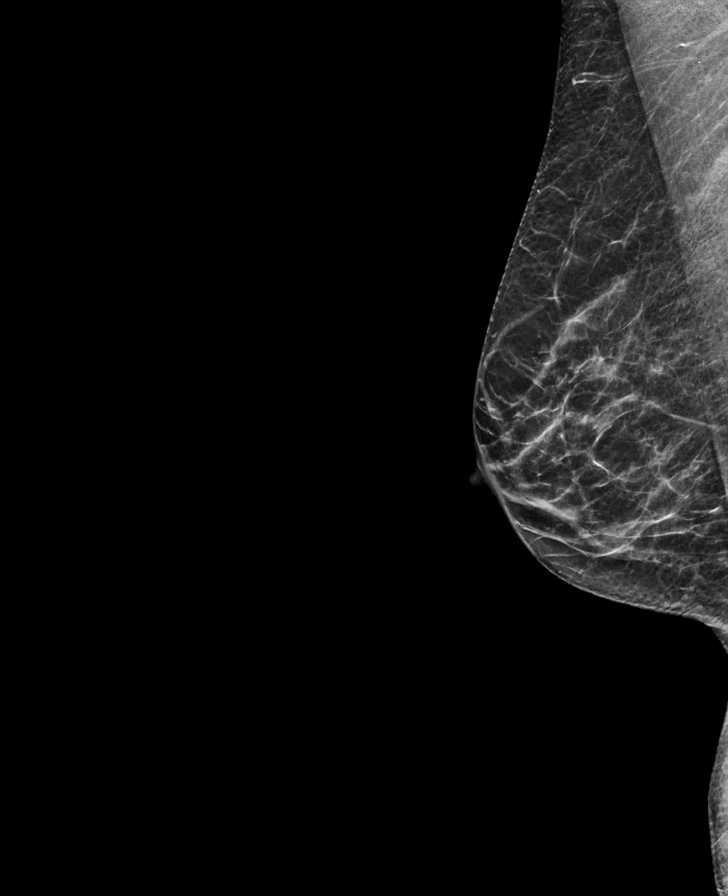

[R CC tomo · tomo slice 28/55.0]
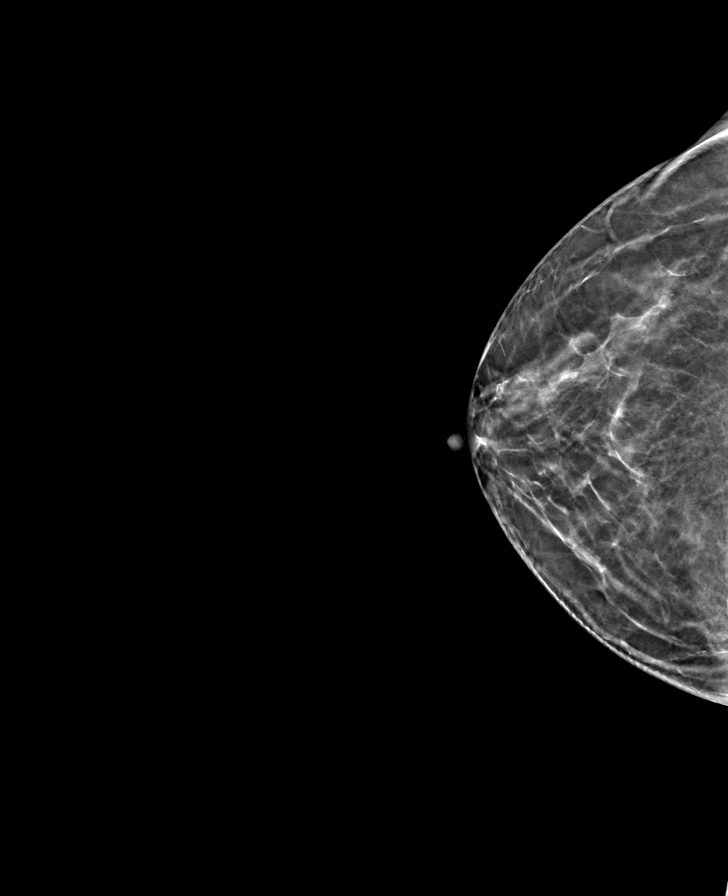

[L CC tomo · tomo slice 27/54.0]
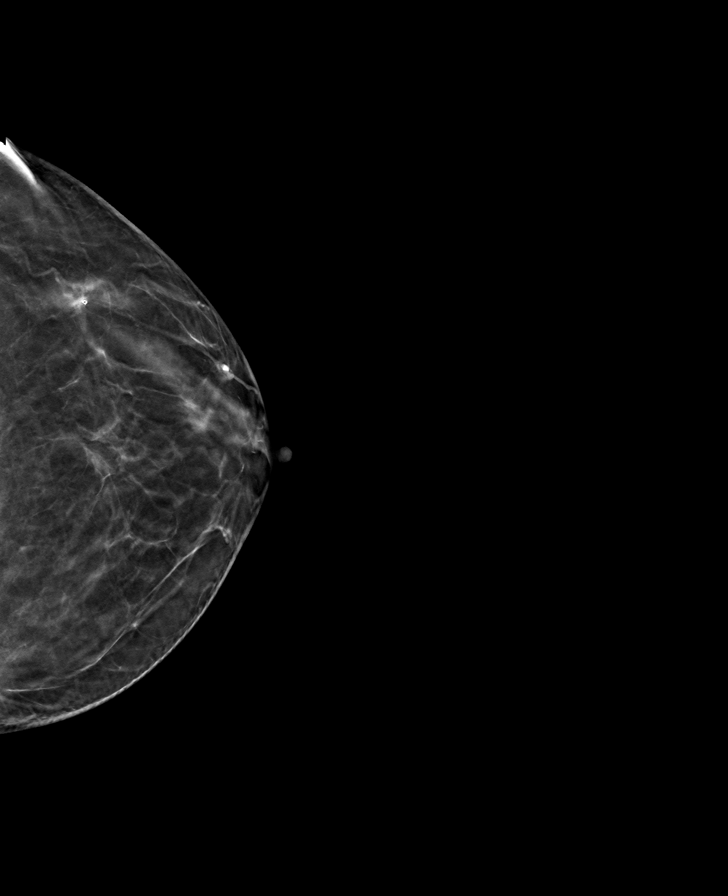

[R MLO tomo · tomo slice 27/54.0]
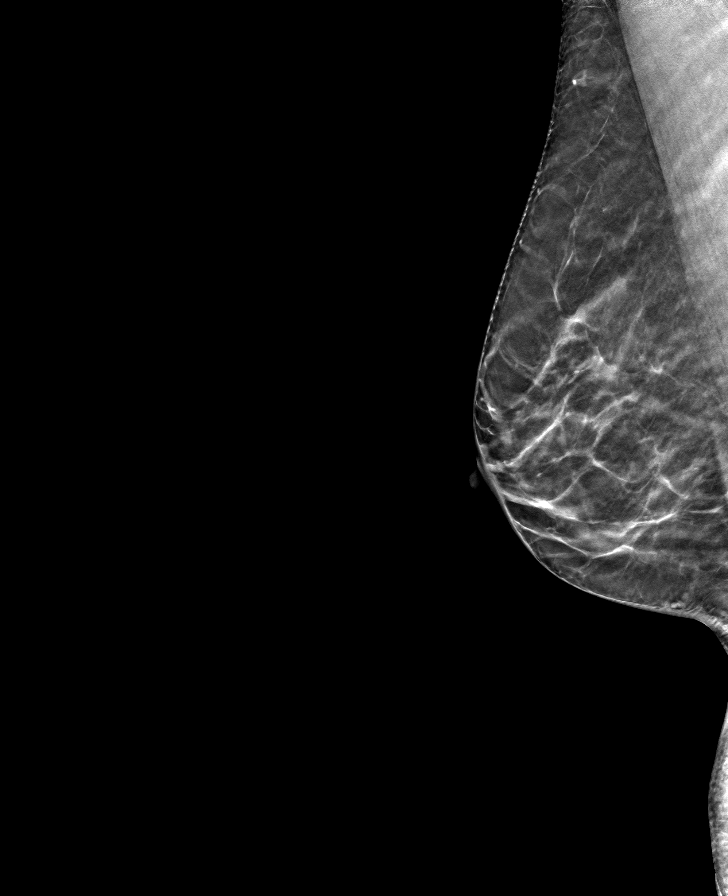

[L MLO tomo · tomo slice 28/55.0]
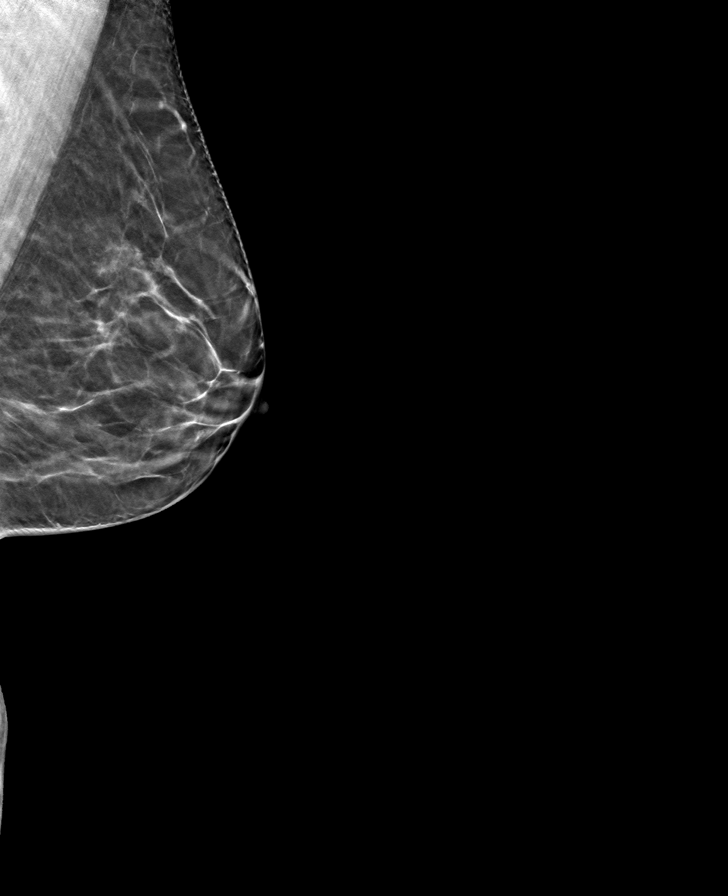

[8 of 24 positions shown; findings below may reference images not displayed]

ACR Breast Density Category b: There are scattered areas of
fibroglandular density.
FINDINGS: There are no findings suspicious for malignancy.
IMPRESSION: No mammographic evidence of malignancy. A result letter of this
screening mammogram will be mailed directly to the patient.

RECOMMENDATION:
Screening mammogram in one year. (Code:51-O-LD2)

BI-RADS CATEGORY  1: Negative.

## 2023-12-23 ENCOUNTER — Other Ambulatory Visit: Payer: Self-pay | Admitting: Family Medicine

## 2023-12-23 DIAGNOSIS — Z1231 Encounter for screening mammogram for malignant neoplasm of breast: Secondary | ICD-10-CM

## 2023-12-24 ENCOUNTER — Telehealth: Payer: Self-pay

## 2023-12-24 DIAGNOSIS — Z0000 Encounter for general adult medical examination without abnormal findings: Secondary | ICD-10-CM

## 2023-12-24 DIAGNOSIS — E785 Hyperlipidemia, unspecified: Secondary | ICD-10-CM

## 2023-12-24 DIAGNOSIS — R7303 Prediabetes: Secondary | ICD-10-CM

## 2023-12-24 DIAGNOSIS — I10 Essential (primary) hypertension: Secondary | ICD-10-CM

## 2023-12-24 NOTE — Telephone Encounter (Signed)
 Lab orders have been placed per pt request (including A1C).  LVM advising pt request had been received and completed.    Copied from CRM [id number]. Topic: Clinical - Request for Lab/Test Order >> Dec 24, 2023  3:41 PM Diannia H wrote: Reason for CRM: Patient called and she wants the provider to send her orders in for [facility] for labs and dont forget to included her A1C order, once she completes the labs she will call back and make an appointment, could you assist? Could you call the patient and let her know once the lab orders are in?

## 2023-12-25 ENCOUNTER — Telehealth: Payer: Self-pay

## 2023-12-25 ENCOUNTER — Other Ambulatory Visit: Payer: Self-pay

## 2023-12-25 DIAGNOSIS — I10 Essential (primary) hypertension: Secondary | ICD-10-CM

## 2023-12-25 DIAGNOSIS — E785 Hyperlipidemia, unspecified: Secondary | ICD-10-CM

## 2023-12-25 DIAGNOSIS — Z0000 Encounter for general adult medical examination without abnormal findings: Secondary | ICD-10-CM

## 2023-12-25 DIAGNOSIS — R7303 Prediabetes: Secondary | ICD-10-CM

## 2023-12-25 NOTE — Telephone Encounter (Signed)
 Copied from CRM [id number]. Topic: Clinical - Request for Lab/Test Order >> Dec 24, 2023  4:30 PM Miquel SAILOR wrote: Reason for CRM: HgB A1c (Order [id number]) Lipid Panel w/reflex Direct LDL (Order [id number]) Comprehensive Metabolic Panel (CMET) (Order [id number]) CBC with Differential/Platelet (Order [id number])  Patient calling in on Lab down stairs is not longer [facility] it is [facility] now. She needs info on where does she go for her labs just put in on 07/15. Called office stated patient could go to any [facility] or Request orders from [facility]. I let patient know she stated does the office do labs in there office. Will call back tomorrow she stated. [phone] (Mobile)

## 2023-12-25 NOTE — Telephone Encounter (Signed)
 Attempted call to patient at listed number. Left a voice mail message requesting a return call.

## 2023-12-26 NOTE — Telephone Encounter (Signed)
 Spoke with patient - given lab hours and information.

## 2023-12-28 LAB — CBC WITH DIFFERENTIAL/PLATELET
Basophils Absolute: 0 x10E3/uL (ref 0.0–0.2)
Basos: 0 %
EOS (ABSOLUTE): 0.1 x10E3/uL (ref 0.0–0.4)
Eos: 1 %
Hematocrit: 44.2 % (ref 34.0–46.6)
Hemoglobin: 13.8 g/dL (ref 11.1–15.9)
Immature Grans (Abs): 0 x10E3/uL (ref 0.0–0.1)
Immature Granulocytes: 0 %
Lymphocytes Absolute: 1.5 x10E3/uL (ref 0.7–3.1)
Lymphs: 22 %
MCH: 27.3 pg (ref 26.6–33.0)
MCHC: 31.2 g/dL — ABNORMAL LOW (ref 31.5–35.7)
MCV: 87 fL (ref 79–97)
Monocytes Absolute: 0.4 x10E3/uL (ref 0.1–0.9)
Monocytes: 6 %
Neutrophils Absolute: 4.8 x10E3/uL (ref 1.4–7.0)
Neutrophils: 71 %
Platelets: 271 x10E3/uL (ref 150–450)
RBC: 5.06 x10E6/uL (ref 3.77–5.28)
RDW: 12.4 % (ref 11.7–15.4)
WBC: 6.9 x10E3/uL (ref 3.4–10.8)

## 2023-12-28 LAB — CMP14+EGFR
ALT: 12 IU/L (ref 0–32)
AST: 17 IU/L (ref 0–40)
Albumin: 4.3 g/dL (ref 3.9–4.9)
Alkaline Phosphatase: 72 IU/L (ref 44–121)
BUN/Creatinine Ratio: 19 (ref 12–28)
BUN: 16 mg/dL (ref 8–27)
Bilirubin Total: 0.3 mg/dL (ref 0.0–1.2)
CO2: 25 mmol/L (ref 20–29)
Calcium: 10 mg/dL (ref 8.7–10.3)
Chloride: 97 mmol/L (ref 96–106)
Creatinine, Ser: 0.86 mg/dL (ref 0.57–1.00)
Globulin, Total: 2 g/dL (ref 1.5–4.5)
Glucose: 88 mg/dL (ref 70–99)
Potassium: 4.5 mmol/L (ref 3.5–5.2)
Sodium: 138 mmol/L (ref 134–144)
Total Protein: 6.3 g/dL (ref 6.0–8.5)
eGFR: 73 mL/min/1.73 (ref >59)

## 2023-12-28 LAB — LIPID PANEL
Chol/HDL Ratio: 3.2 ratio (ref 0.0–4.4)
Cholesterol, Total: 201 mg/dL — ABNORMAL HIGH (ref 100–199)
HDL: 63 mg/dL (ref >39)
LDL Chol Calc (NIH): 129 mg/dL — ABNORMAL HIGH (ref 0–99)
Triglycerides: 51 mg/dL (ref 0–149)
VLDL Cholesterol Cal: 9 mg/dL (ref 5–40)

## 2023-12-28 LAB — HEMOGLOBIN A1C
Est. average glucose Bld gHb Est-mCnc: 120 mg/dL
Hgb A1c MFr Bld: 5.8 % — ABNORMAL HIGH (ref 4.8–5.6)

## 2024-01-09 ENCOUNTER — Telehealth: Payer: Self-pay | Admitting: Family Medicine

## 2024-01-09 ENCOUNTER — Encounter: Admitting: Family Medicine

## 2024-01-09 DIAGNOSIS — I10 Essential (primary) hypertension: Secondary | ICD-10-CM

## 2024-01-09 MED ORDER — LISINOPRIL-HYDROCHLOROTHIAZIDE 10-12.5 MG PO TABS: 1.0000 | ORAL_TABLET | Freq: Every day | ORAL | 0 refills | Status: DC

## 2024-01-09 NOTE — Telephone Encounter (Signed)
 Patient states she will be needing a refill on her Lisinopril  soon. Pharmacy: [facility]

## 2024-01-23 ENCOUNTER — Ambulatory Visit (INDEPENDENT_AMBULATORY_CARE_PROVIDER_SITE_OTHER)

## 2024-01-23 DIAGNOSIS — Z1231 Encounter for screening mammogram for malignant neoplasm of breast: Secondary | ICD-10-CM

## 2024-01-29 ENCOUNTER — Encounter: Payer: Self-pay | Admitting: Family Medicine

## 2024-01-29 ENCOUNTER — Ambulatory Visit (INDEPENDENT_AMBULATORY_CARE_PROVIDER_SITE_OTHER): Admitting: Family Medicine

## 2024-01-29 VITALS — BP 102/62 | HR 78 | Ht 64.0 in | Wt 130.8 lb

## 2024-01-29 DIAGNOSIS — I10 Essential (primary) hypertension: Secondary | ICD-10-CM | POA: Diagnosis not present

## 2024-01-29 DIAGNOSIS — Z0000 Encounter for general adult medical examination without abnormal findings: Secondary | ICD-10-CM | POA: Diagnosis not present

## 2024-01-29 MED ORDER — LISINOPRIL-HYDROCHLOROTHIAZIDE 10-12.5 MG PO TABS: 1.0000 | ORAL_TABLET | Freq: Every day | ORAL | 3 refills | Status: DC

## 2024-01-29 NOTE — Assessment & Plan Note (Signed)
 Well adult Recent labs reviewed with her in detail.Lipids up slightly and A1c improved.  Immunizations:  UTD Screenings: UTD Anticipatory guidance/Risk factor reduction:  Recommendations per AVS.

## 2024-01-29 NOTE — Progress Notes (Signed)
 Carolyn Berry - 68 y.o. female MRN [id number]  Date of birth: May 22, 1956  Subjective Chief Complaint  Patient presents with   Insomnia    HPI Carolyn Berry is a 68 y.o. female here today for annual exam.   She reports that she is doing pretty well.  She had labs completed prior to visit.  A1c improved some, still in prediabetes range. Cholesterol is elevated, up a little since last year.   She is moderately active.  She feels that diet is pretty good.   She is a non-smoker.  Rare EtOH use.   Review of Systems  Constitutional:  Negative for chills, fever, malaise/fatigue and weight loss.  HENT:  Negative for congestion, ear pain and sore throat.   Eyes:  Negative for blurred vision, double vision and pain.  Respiratory:  Negative for cough and shortness of breath.   Cardiovascular:  Negative for chest pain and palpitations.  Gastrointestinal:  Negative for abdominal pain, blood in stool, constipation, heartburn and nausea.  Genitourinary:  Negative for dysuria and urgency.  Musculoskeletal:  Negative for joint pain and myalgias.  Neurological:  Negative for dizziness and headaches.  Endo/Heme/Allergies:  Does not bruise/bleed easily.  Psychiatric/Behavioral:  Negative for depression. The patient is not nervous/anxious and does not have insomnia.       No Known Allergies  Past Medical History:  Diagnosis Date   Elevated blood-pressure reading without diagnosis of hypertension    Hypertension    Osteoporosis    osteopenis    Past Surgical History:  Procedure Laterality Date   COLONOSCOPY  2011   POLYPECTOMY     skin tumor removal ?lipoma - benign     TUBAL LIGATION  1985    Social History   Socioeconomic History   Marital status: Married    Spouse name: Not on file   Number of children: Not on file   Years of education: Not on file   Highest education level: Not on file  Occupational History   Not on file  Tobacco Use   Smoking status:  Never   Smokeless tobacco: Never  Vaping Use   Vaping status: Never Used  Substance and Sexual Activity   Alcohol use: Yes    Comment: rare use, social    Drug use: No   Sexual activity: Yes    Partners: Male  Other Topics Concern   Not on file  Social History Narrative   Not on file   Social Drivers of Health   Financial Resource Strain: Not on file  Food Insecurity: Not on file  Transportation Needs: Not on file  Physical Activity: Not on file  Stress: Not on file  Social Connections: Not on file    Family History  Problem Relation Age of Onset   Alcoholism Other        brother   Cancer Mother    Hypertension Mother    Melanoma Mother    Hypertension Father    Colon cancer Neg Hx    Colon polyps Neg Hx    Esophageal cancer Neg Hx    Rectal cancer Neg Hx    Stomach cancer Neg Hx     Health Maintenance  Topic Date Due   Hepatitis C Screening  Never done   COVID-19 Vaccine (6 - 2024-25 season) 02/10/2023   INFLUENZA VACCINE  01/10/2024   MAMMOGRAM  01/22/2026   Colonoscopy  02/04/2027   DTaP/Tdap/Td (3 - Td or Tdap) 03/22/2030   Pneumococcal Vaccine: 50+  Years  Completed   DEXA SCAN  Completed   Zoster Vaccines- Shingrix   Completed   HPV VACCINES  Aged Out   Meningococcal B Vaccine  Aged Out     ----------------------------------------------------------------------------------------------------------------------------------------------------------------------------------------------------------------- Physical Exam BP 102/62 (BP Location: Left Arm, Patient Position: Sitting, Cuff Size: Small)   Pulse 78   Ht 5' 4 (1.626 m)   Wt 130 lb 12.8 oz (59.3 kg)   SpO2 98%   BMI 22.45 kg/m   Physical Exam Constitutional:      General: She is not in acute distress. HENT:     Head: Normocephalic and atraumatic.     Right Ear: Tympanic membrane and ear canal normal.     Left Ear: Tympanic membrane and ear canal normal.     Nose: Nose normal.  Eyes:      General: No scleral icterus.    Conjunctiva/sclera: Conjunctivae normal.  Neck:     Thyroid : No thyromegaly.  Cardiovascular:     Rate and Rhythm: Normal rate and regular rhythm.     Heart sounds: Normal heart sounds.  Pulmonary:     Effort: Pulmonary effort is normal.     Breath sounds: Normal breath sounds.  Abdominal:     General: Bowel sounds are normal. There is no distension.     Palpations: Abdomen is soft.     Tenderness: There is no abdominal tenderness. There is no guarding.  Musculoskeletal:        General: Normal range of motion.     Cervical back: Normal range of motion and neck supple.  Lymphadenopathy:     Cervical: No cervical adenopathy.  Skin:    General: Skin is warm and dry.     Findings: No rash.  Neurological:     General: No focal deficit present.     Mental Status: She is alert and oriented to person, place, and time.     Cranial Nerves: No cranial nerve deficit.     Coordination: Coordination normal.  Psychiatric:        Mood and Affect: Mood normal.        Behavior: Behavior normal.     ------------------------------------------------------------------------------------------------------------------------------------------------------------------------------------------------------------------- Assessment and Plan  Well adult exam Well adult Recent labs reviewed with her in detail.Lipids up slightly and A1c improved.  Immunizations:  UTD Screenings: UTD Anticipatory guidance/Risk factor reduction:  Recommendations per AVS.    Meds ordered this encounter  Medications   lisinopril -hydrochlorothiazide  (ZESTORETIC ) 10-12.5 MG tablet    Sig: Take 1 tablet by mouth daily.    Dispense:  90 tablet    Refill:  3    No follow-ups on file.

## 2024-01-29 NOTE — Patient Instructions (Signed)
 Preventive Care 83 Years and Older, Female Preventive care refers to lifestyle choices and visits with your health care provider that can promote health and wellness. Preventive care visits are also called wellness exams. What can I expect for my preventive care visit? Counseling Your health care provider may ask you questions about your: Medical history, including: Past medical problems. Family medical history. Pregnancy and menstrual history. History of falls. Current health, including: Memory and ability to understand (cognition). Emotional well-being. Home life and relationship well-being. Sexual activity and sexual health. Lifestyle, including: Alcohol, nicotine or tobacco, and drug use. Access to firearms. Diet, exercise, and sleep habits. Work and work Astronomer. Sunscreen use. Safety issues such as seatbelt and bike helmet use. Physical exam Your health care provider will check your: Height and weight. These may be used to calculate your BMI (body mass index). BMI is a measurement that tells if you are at a healthy weight. Waist circumference. This measures the distance around your waistline. This measurement also tells if you are at a healthy weight and may help predict your risk of certain diseases, such as type 2 diabetes and high blood pressure. Heart rate and blood pressure. Body temperature. Skin for abnormal spots. What immunizations do I need?  Vaccines are usually given at various ages, according to a schedule. Your health care provider will recommend vaccines for you based on your age, medical history, and lifestyle or other factors, such as travel or where you work. What tests do I need? Screening Your health care provider may recommend screening tests for certain conditions. This may include: Lipid and cholesterol levels. Hepatitis C test. Hepatitis B test. HIV (human immunodeficiency virus) test. STI (sexually transmitted infection) testing, if you are at  risk. Lung cancer screening. Colorectal cancer screening. Diabetes screening. This is done by checking your blood sugar (glucose) after you have not eaten for a while (fasting). Mammogram. Talk with your health care provider about how often you should have regular mammograms. BRCA-related cancer screening. This may be done if you have a family history of breast, ovarian, tubal, or peritoneal cancers. Bone density scan. This is done to screen for osteoporosis. Talk with your health care provider about your test results, treatment options, and if necessary, the need for more tests. Follow these instructions at home: Eating and drinking  Eat a diet that includes fresh fruits and vegetables, whole grains, lean protein, and low-fat dairy products. Limit your intake of foods with high amounts of sugar, saturated fats, and salt. Take vitamin and mineral supplements as recommended by your health care provider. Do not drink alcohol if your health care provider tells you not to drink. If you drink alcohol: Limit how much you have to 0-1 drink a day. Know how much alcohol is in your drink. In the [place] one drink equals one 12 oz bottle of beer (355 mL), one 5 oz glass of wine (148 mL), or one 1 oz glass of hard liquor (44 mL). Lifestyle Brush your teeth every morning and night with fluoride toothpaste. Floss one time each day. Exercise for at least 30 minutes 5 or more days each week. Do not use any products that contain nicotine or tobacco. These products include cigarettes, chewing tobacco, and vaping devices, such as e-cigarettes. If you need help quitting, ask your health care provider. Do not use drugs. If you are sexually active, practice safe sex. Use a condom or other form of protection in order to prevent STIs. Take aspirin only as told by  your health care provider. Make sure that you understand how much to take and what form to take. Work with your health care provider to find out whether it  is safe and beneficial for you to take aspirin daily. Ask your health care provider if you need to take a cholesterol-lowering medicine (statin). Find healthy ways to manage stress, such as: Meditation, yoga, or listening to music. Journaling. Talking to a trusted person. Spending time with friends and family. Minimize exposure to UV radiation to reduce your risk of skin cancer. Safety Always wear your seat belt while driving or riding in a vehicle. Do not drive: If you have been drinking alcohol. Do not ride with someone who has been drinking. When you are tired or distracted. While texting. If you have been using any mind-altering substances or drugs. Wear a helmet and other protective equipment during sports activities. If you have firearms in your house, make sure you follow all gun safety procedures. What's next? Visit your health care provider once a year for an annual wellness visit. Ask your health care provider how often you should have your eyes and teeth checked. Stay up to date on all vaccines. This information is not intended to replace advice given to you by your health care provider. Make sure you discuss any questions you have with your health care provider. Document Revised: 11/23/2020 Document Reviewed: 11/23/2020 Elsevier Patient Education  2024 [employer].

## 2024-01-30 NOTE — Addendum Note (Signed)
 Addended by: ALVIA VELMA BRAVO on: 01/30/2024 10:10 AM   Modules accepted: Level of Service

## 2024-04-09 ENCOUNTER — Telehealth: Payer: Self-pay

## 2024-04-09 DIAGNOSIS — I10 Essential (primary) hypertension: Secondary | ICD-10-CM

## 2024-04-09 MED ORDER — LISINOPRIL-HYDROCHLOROTHIAZIDE 10-12.5 MG PO TABS: 1.0000 | ORAL_TABLET | Freq: Every day | ORAL | 3 refills | Status: AC

## 2024-04-09 NOTE — Telephone Encounter (Signed)
 Copied from CRM #[id number]. Topic: Clinical - Medication Question >> Apr 09, 2024 10:39 AM Carolyn Berry wrote: Reason for CRM: Pt recently moved and would like lisinopril -hydrochlorothiazide  (ZESTORETIC ) 10-12.5 MG tablet transferred to a [facility] in [place], [place]. She will call back with the address.

## 2024-04-09 NOTE — Telephone Encounter (Deleted)
 Carolyn Berry, Carolyn Berry (Patient)   Subject  Carolyn Berry, Carolyn Berry (Patient)   Topic  Clinical - Medication Question    Communication  Reason for CRM: Pt recently moved and would like lisinopril -hydrochlorothiazide  (ZESTORETIC ) 10-12.5 MG tablet transferred to a [facility] in [place], [place]. She will call back with the address.
